# Patient Record
Sex: Female | Born: 1965 | Race: White | Hispanic: No | Smoking: Never smoker
Health system: Southern US, Community
[De-identification: ages and names within clinical notes are randomized; demographics above are authoritative.]

## PROBLEM LIST (undated history)

## (undated) DIAGNOSIS — F419 Anxiety disorder, unspecified: Secondary | ICD-10-CM

## (undated) DIAGNOSIS — L309 Dermatitis, unspecified: Secondary | ICD-10-CM

## (undated) DIAGNOSIS — F341 Dysthymic disorder: Secondary | ICD-10-CM

## (undated) DIAGNOSIS — E28319 Asymptomatic premature menopause: Secondary | ICD-10-CM

## (undated) DIAGNOSIS — Z79890 Hormone replacement therapy (postmenopausal): Secondary | ICD-10-CM

## (undated) DIAGNOSIS — F329 Major depressive disorder, single episode, unspecified: Secondary | ICD-10-CM

## (undated) HISTORY — DX: Hormone replacement therapy: E28.319

## (undated) HISTORY — DX: Major depressive disorder, single episode, unspecified: F32.9

## (undated) HISTORY — DX: Anxiety disorder, unspecified: F41.9

## (undated) HISTORY — PX: COLONOSCOPY: SHX174

## (undated) HISTORY — PX: TONSILLECTOMY: SUR1361

## (undated) HISTORY — PX: ADENOIDECTOMY: SUR15

## (undated) HISTORY — PX: WISDOM TOOTH EXTRACTION: SHX21

## (undated) HISTORY — DX: Asymptomatic premature menopause: Z79.890

## (undated) HISTORY — DX: Dysthymic disorder: F34.1

## (undated) HISTORY — DX: Dermatitis, unspecified: L30.9

---

## 2009-06-30 ENCOUNTER — Ambulatory Visit: Payer: Self-pay | Admitting: Sports Medicine

## 2009-06-30 DIAGNOSIS — M7750 Other enthesopathy of unspecified foot: Secondary | ICD-10-CM | POA: Insufficient documentation

## 2012-01-11 ENCOUNTER — Telehealth: Payer: Self-pay

## 2012-01-11 NOTE — Telephone Encounter (Signed)
Pt would like a call back in regards to getting cymbalta refilled

## 2012-01-13 MED ORDER — DULOXETINE HCL 60 MG PO CPEP: 60.0000 mg | ORAL_CAPSULE | Freq: Every day | ORAL | Status: DC

## 2012-01-13 NOTE — Telephone Encounter (Signed)
Spoke with patient, she understands she is due for an office visit.  Is going out of town this weekend, but will recheck next week.  Ok'd rx for 2 weeks until she can come in.  Patient last seen 06/2011.

## 2012-02-03 ENCOUNTER — Ambulatory Visit (INDEPENDENT_AMBULATORY_CARE_PROVIDER_SITE_OTHER): Payer: 59 | Admitting: Physician Assistant

## 2012-02-03 ENCOUNTER — Encounter: Payer: Self-pay | Admitting: Physician Assistant

## 2012-02-03 VITALS — BP 109/72 | HR 64 | Temp 98.1°F | Resp 16 | Ht 64.0 in | Wt 136.8 lb

## 2012-02-03 DIAGNOSIS — F3289 Other specified depressive episodes: Secondary | ICD-10-CM

## 2012-02-03 DIAGNOSIS — F329 Major depressive disorder, single episode, unspecified: Secondary | ICD-10-CM

## 2012-02-03 DIAGNOSIS — F32A Depression, unspecified: Secondary | ICD-10-CM

## 2012-02-03 MED ORDER — DULOXETINE HCL 60 MG PO CPEP: 60.0000 mg | ORAL_CAPSULE | Freq: Every day | ORAL | Status: DC

## 2012-02-03 NOTE — Progress Notes (Signed)
  Subjective:    Patient ID: Kelly Jensen, female    DOB: 02/18/1966, 46 y.o.   MRN: [id number]  HPI Pt presents for recheck of depression. Doing really good on Cymbalta 60mg .  Feels like it is the correct dose and really happy with the results.  No really seeing Dr. Ledon Snare any more because she is doing so good.   Review of Systems     Objective:   Physical Exam  Constitutional: She is oriented to person, place, and time. She appears well-developed and well-nourished.  HENT:  Head: Normocephalic and atraumatic.  Right Ear: External ear normal.  Left Ear: External ear normal.  Eyes: Conjunctivae are normal.  Pulmonary/Chest: Effort normal.  Neurological: She is alert and oriented to person, place, and time.  Skin: Skin is warm and dry.  Psychiatric: She has a normal mood and affect. Her behavior is normal. Judgment and thought content normal.          Assessment & Plan:  Depression - continue Cymbalta 60mg  - sent Rx for 1 yr.  F/u sooner if any change in symptoms or any problems.

## 2012-10-05 ENCOUNTER — Ambulatory Visit (INDEPENDENT_AMBULATORY_CARE_PROVIDER_SITE_OTHER): Payer: 59 | Admitting: Sports Medicine

## 2012-10-05 VITALS — BP 100/60 | Ht 64.0 in | Wt 130.0 lb

## 2012-10-05 DIAGNOSIS — M7750 Other enthesopathy of unspecified foot: Secondary | ICD-10-CM

## 2012-10-05 NOTE — Assessment & Plan Note (Addendum)
Patient has significant left foot fourth  Metatarsalgia. Additionally patient has significant transverse arch breakdown. Intervention:  Used metatarsal pads correctly positioned in the existing insoles and supplied patient with a pair of sports insoles with correctly placed metatarsal pads.  Additionally placed a first ray post under the left great toe. Followup as needed

## 2012-10-05 NOTE — Progress Notes (Signed)
Kelly Jensen is a 46 y.o. female who presents to [facility] today for left foot pain for 6 months. Patient notes pain in the dorsal aspect of her 3rd and 4th metatarsal heads. She recently completed a half marathon and is running 20-25 miles a week.  She diagnosed herself with metatarsalgia and placed a metatarsal pad directly underneath the metatarsal heads which increased her pain.  She denies any radiating pain weakness or numbness and feels well otherwise.    PMH reviewed. Otherwise healthy History  Substance Use Topics  . Smoking status: Never Smoker   . Smokeless tobacco: Not on file  . Alcohol Use: Not on file   ROS as above otherwise neg   Exam:  BP 100/60  Ht 5\' 4"  (1.626 m)  Wt 130 lb (58.968 kg)  BMI 22.31 kg/m2 Gen: Well NAD MSK: Left foot: Significant breakdown of the transverse arch with callus formation under metatarsal heads 2,3 and 4.  Deviation of the fourth and fifth toes and hammertoe deformity on the fourth.  Mildly tender on the fourth metatarsal head dorsal aspect. Longitudinal arch is preserved Normal sensation and capillary refill and pulses  Leg length: No discrepancy  Hip abductor strength: 5 /5 bilaterally  Gait: Generally well-appearing gait. Patient is a forefoot Striker with excessive terminal pronation on the left compared to the right.

## 2012-10-05 NOTE — Patient Instructions (Addendum)
Thank you for coming in today. You have metatarsalgia due to breakdown of the transverse arch.  Wear the insoles.  Let us know how you do.  You have Hapad Small Matatarsal pads in.  The green insoles are Hapad Sports insoles.  They should last 3-6 months.  The goal is to be just behind the metatarsal heads (bones) Come back as needed.

## 2013-02-03 ENCOUNTER — Telehealth: Payer: Self-pay

## 2013-02-03 MED ORDER — DULOXETINE HCL 60 MG PO CPEP: 60.0000 mg | ORAL_CAPSULE | Freq: Every day | ORAL | Status: DC

## 2013-02-03 NOTE — Telephone Encounter (Signed)
Pt needs a refill Cymbalta and wants someone to call her back

## 2013-02-03 NOTE — Telephone Encounter (Signed)
Cymbalta was written last year. Advised her will ask if we can send in a supply until she can come back in. Pended 12mo (since this is the least she can get from mail order). She knows she is due for appt.

## 2013-02-03 NOTE — Telephone Encounter (Signed)
Meds ordered this encounter  Medications  . DULoxetine (CYMBALTA) 60 MG capsule    Sig: Take 1 capsule (60 mg total) by mouth daily.    Dispense:  90 capsule    Refill:  0   Patient will need re-evaluation for additional refills.

## 2014-04-11 DIAGNOSIS — F339 Major depressive disorder, recurrent, unspecified: Secondary | ICD-10-CM | POA: Insufficient documentation

## 2014-04-11 DIAGNOSIS — E28319 Asymptomatic premature menopause: Secondary | ICD-10-CM | POA: Insufficient documentation

## 2014-04-11 DIAGNOSIS — N959 Unspecified menopausal and perimenopausal disorder: Secondary | ICD-10-CM | POA: Insufficient documentation

## 2016-12-14 DIAGNOSIS — Z23 Encounter for immunization: Secondary | ICD-10-CM | POA: Diagnosis not present

## 2017-01-28 DIAGNOSIS — H0289 Other specified disorders of eyelid: Secondary | ICD-10-CM | POA: Diagnosis not present

## 2017-01-28 DIAGNOSIS — L918 Other hypertrophic disorders of the skin: Secondary | ICD-10-CM | POA: Diagnosis not present

## 2017-03-16 DIAGNOSIS — H10413 Chronic giant papillary conjunctivitis, bilateral: Secondary | ICD-10-CM | POA: Diagnosis not present

## 2017-04-11 DIAGNOSIS — M531 Cervicobrachial syndrome: Secondary | ICD-10-CM | POA: Diagnosis not present

## 2017-04-11 DIAGNOSIS — M9901 Segmental and somatic dysfunction of cervical region: Secondary | ICD-10-CM | POA: Diagnosis not present

## 2017-04-11 DIAGNOSIS — M5386 Other specified dorsopathies, lumbar region: Secondary | ICD-10-CM | POA: Diagnosis not present

## 2017-04-12 DIAGNOSIS — M9901 Segmental and somatic dysfunction of cervical region: Secondary | ICD-10-CM | POA: Diagnosis not present

## 2017-04-12 DIAGNOSIS — M5386 Other specified dorsopathies, lumbar region: Secondary | ICD-10-CM | POA: Diagnosis not present

## 2017-04-12 DIAGNOSIS — M531 Cervicobrachial syndrome: Secondary | ICD-10-CM | POA: Diagnosis not present

## 2017-04-14 DIAGNOSIS — M531 Cervicobrachial syndrome: Secondary | ICD-10-CM | POA: Diagnosis not present

## 2017-04-14 DIAGNOSIS — M5386 Other specified dorsopathies, lumbar region: Secondary | ICD-10-CM | POA: Diagnosis not present

## 2017-04-14 DIAGNOSIS — M9901 Segmental and somatic dysfunction of cervical region: Secondary | ICD-10-CM | POA: Diagnosis not present

## 2017-04-15 DIAGNOSIS — N760 Acute vaginitis: Secondary | ICD-10-CM | POA: Diagnosis not present

## 2017-04-15 DIAGNOSIS — N390 Urinary tract infection, site not specified: Secondary | ICD-10-CM | POA: Diagnosis not present

## 2017-04-16 DIAGNOSIS — M531 Cervicobrachial syndrome: Secondary | ICD-10-CM | POA: Diagnosis not present

## 2017-04-16 DIAGNOSIS — M5386 Other specified dorsopathies, lumbar region: Secondary | ICD-10-CM | POA: Diagnosis not present

## 2017-04-16 DIAGNOSIS — M9901 Segmental and somatic dysfunction of cervical region: Secondary | ICD-10-CM | POA: Diagnosis not present

## 2017-04-18 DIAGNOSIS — M531 Cervicobrachial syndrome: Secondary | ICD-10-CM | POA: Diagnosis not present

## 2017-04-18 DIAGNOSIS — M9901 Segmental and somatic dysfunction of cervical region: Secondary | ICD-10-CM | POA: Diagnosis not present

## 2017-04-18 DIAGNOSIS — M5386 Other specified dorsopathies, lumbar region: Secondary | ICD-10-CM | POA: Diagnosis not present

## 2017-04-20 DIAGNOSIS — M5386 Other specified dorsopathies, lumbar region: Secondary | ICD-10-CM | POA: Diagnosis not present

## 2017-04-20 DIAGNOSIS — M531 Cervicobrachial syndrome: Secondary | ICD-10-CM | POA: Diagnosis not present

## 2017-04-20 DIAGNOSIS — M9901 Segmental and somatic dysfunction of cervical region: Secondary | ICD-10-CM | POA: Diagnosis not present

## 2017-04-22 DIAGNOSIS — M531 Cervicobrachial syndrome: Secondary | ICD-10-CM | POA: Diagnosis not present

## 2017-04-22 DIAGNOSIS — M5386 Other specified dorsopathies, lumbar region: Secondary | ICD-10-CM | POA: Diagnosis not present

## 2017-04-22 DIAGNOSIS — M9901 Segmental and somatic dysfunction of cervical region: Secondary | ICD-10-CM | POA: Diagnosis not present

## 2017-04-26 DIAGNOSIS — M5386 Other specified dorsopathies, lumbar region: Secondary | ICD-10-CM | POA: Diagnosis not present

## 2017-04-26 DIAGNOSIS — M531 Cervicobrachial syndrome: Secondary | ICD-10-CM | POA: Diagnosis not present

## 2017-04-26 DIAGNOSIS — M9901 Segmental and somatic dysfunction of cervical region: Secondary | ICD-10-CM | POA: Diagnosis not present

## 2017-04-27 DIAGNOSIS — M5386 Other specified dorsopathies, lumbar region: Secondary | ICD-10-CM | POA: Diagnosis not present

## 2017-04-27 DIAGNOSIS — M9901 Segmental and somatic dysfunction of cervical region: Secondary | ICD-10-CM | POA: Diagnosis not present

## 2017-04-27 DIAGNOSIS — M531 Cervicobrachial syndrome: Secondary | ICD-10-CM | POA: Diagnosis not present

## 2017-05-02 DIAGNOSIS — M531 Cervicobrachial syndrome: Secondary | ICD-10-CM | POA: Diagnosis not present

## 2017-05-02 DIAGNOSIS — M9901 Segmental and somatic dysfunction of cervical region: Secondary | ICD-10-CM | POA: Diagnosis not present

## 2017-05-02 DIAGNOSIS — M5386 Other specified dorsopathies, lumbar region: Secondary | ICD-10-CM | POA: Diagnosis not present

## 2017-05-03 DIAGNOSIS — N200 Calculus of kidney: Secondary | ICD-10-CM | POA: Diagnosis not present

## 2017-05-03 DIAGNOSIS — R3915 Urgency of urination: Secondary | ICD-10-CM | POA: Diagnosis not present

## 2017-05-05 DIAGNOSIS — M9901 Segmental and somatic dysfunction of cervical region: Secondary | ICD-10-CM | POA: Diagnosis not present

## 2017-05-05 DIAGNOSIS — M5386 Other specified dorsopathies, lumbar region: Secondary | ICD-10-CM | POA: Diagnosis not present

## 2017-05-05 DIAGNOSIS — M531 Cervicobrachial syndrome: Secondary | ICD-10-CM | POA: Diagnosis not present

## 2017-05-11 DIAGNOSIS — M531 Cervicobrachial syndrome: Secondary | ICD-10-CM | POA: Diagnosis not present

## 2017-05-11 DIAGNOSIS — M9901 Segmental and somatic dysfunction of cervical region: Secondary | ICD-10-CM | POA: Diagnosis not present

## 2017-05-11 DIAGNOSIS — M5386 Other specified dorsopathies, lumbar region: Secondary | ICD-10-CM | POA: Diagnosis not present

## 2017-05-13 DIAGNOSIS — M531 Cervicobrachial syndrome: Secondary | ICD-10-CM | POA: Diagnosis not present

## 2017-05-13 DIAGNOSIS — M5386 Other specified dorsopathies, lumbar region: Secondary | ICD-10-CM | POA: Diagnosis not present

## 2017-05-13 DIAGNOSIS — M9901 Segmental and somatic dysfunction of cervical region: Secondary | ICD-10-CM | POA: Diagnosis not present

## 2017-05-17 DIAGNOSIS — M5386 Other specified dorsopathies, lumbar region: Secondary | ICD-10-CM | POA: Diagnosis not present

## 2017-05-17 DIAGNOSIS — M9901 Segmental and somatic dysfunction of cervical region: Secondary | ICD-10-CM | POA: Diagnosis not present

## 2017-05-17 DIAGNOSIS — M531 Cervicobrachial syndrome: Secondary | ICD-10-CM | POA: Diagnosis not present

## 2017-05-20 DIAGNOSIS — M5386 Other specified dorsopathies, lumbar region: Secondary | ICD-10-CM | POA: Diagnosis not present

## 2017-05-20 DIAGNOSIS — M531 Cervicobrachial syndrome: Secondary | ICD-10-CM | POA: Diagnosis not present

## 2017-05-20 DIAGNOSIS — M9901 Segmental and somatic dysfunction of cervical region: Secondary | ICD-10-CM | POA: Diagnosis not present

## 2017-05-25 DIAGNOSIS — M531 Cervicobrachial syndrome: Secondary | ICD-10-CM | POA: Diagnosis not present

## 2017-05-25 DIAGNOSIS — M5386 Other specified dorsopathies, lumbar region: Secondary | ICD-10-CM | POA: Diagnosis not present

## 2017-05-25 DIAGNOSIS — M9901 Segmental and somatic dysfunction of cervical region: Secondary | ICD-10-CM | POA: Diagnosis not present

## 2017-06-14 DIAGNOSIS — M25552 Pain in left hip: Secondary | ICD-10-CM | POA: Diagnosis not present

## 2017-06-20 DIAGNOSIS — M25552 Pain in left hip: Secondary | ICD-10-CM | POA: Diagnosis not present

## 2017-06-23 DIAGNOSIS — M25552 Pain in left hip: Secondary | ICD-10-CM | POA: Diagnosis not present

## 2017-06-27 DIAGNOSIS — M25552 Pain in left hip: Secondary | ICD-10-CM | POA: Diagnosis not present

## 2017-07-04 DIAGNOSIS — M25552 Pain in left hip: Secondary | ICD-10-CM | POA: Diagnosis not present

## 2017-07-11 DIAGNOSIS — M25552 Pain in left hip: Secondary | ICD-10-CM | POA: Diagnosis not present

## 2017-08-30 DIAGNOSIS — Z23 Encounter for immunization: Secondary | ICD-10-CM | POA: Diagnosis not present

## 2018-01-03 DIAGNOSIS — Z1231 Encounter for screening mammogram for malignant neoplasm of breast: Secondary | ICD-10-CM | POA: Diagnosis not present

## 2018-01-03 DIAGNOSIS — Z01419 Encounter for gynecological examination (general) (routine) without abnormal findings: Secondary | ICD-10-CM | POA: Diagnosis not present

## 2018-03-09 DIAGNOSIS — H10413 Chronic giant papillary conjunctivitis, bilateral: Secondary | ICD-10-CM | POA: Diagnosis not present

## 2018-05-17 ENCOUNTER — Ambulatory Visit: Payer: 59 | Admitting: Family Medicine

## 2018-05-17 ENCOUNTER — Other Ambulatory Visit: Payer: Self-pay

## 2018-05-17 ENCOUNTER — Encounter: Payer: Self-pay | Admitting: Family Medicine

## 2018-05-17 VITALS — BP 102/60 | HR 74 | Temp 97.6°F | Ht 64.0 in | Wt 143.6 lb

## 2018-05-17 DIAGNOSIS — Z79899 Other long term (current) drug therapy: Secondary | ICD-10-CM

## 2018-05-17 DIAGNOSIS — F40243 Fear of flying: Secondary | ICD-10-CM | POA: Diagnosis not present

## 2018-05-17 DIAGNOSIS — Z114 Encounter for screening for human immunodeficiency virus [HIV]: Secondary | ICD-10-CM

## 2018-05-17 DIAGNOSIS — F341 Dysthymic disorder: Secondary | ICD-10-CM

## 2018-05-17 DIAGNOSIS — Z780 Asymptomatic menopausal state: Secondary | ICD-10-CM

## 2018-05-17 DIAGNOSIS — Z7189 Other specified counseling: Secondary | ICD-10-CM | POA: Diagnosis not present

## 2018-05-17 DIAGNOSIS — Z1211 Encounter for screening for malignant neoplasm of colon: Secondary | ICD-10-CM | POA: Diagnosis not present

## 2018-05-17 DIAGNOSIS — Z7184 Encounter for health counseling related to travel: Secondary | ICD-10-CM

## 2018-05-17 DIAGNOSIS — Z23 Encounter for immunization: Secondary | ICD-10-CM | POA: Diagnosis not present

## 2018-05-17 LAB — COMPREHENSIVE METABOLIC PANEL
ALT: 11 U/L (ref 0–35)
AST: 15 U/L (ref 0–37)
Albumin: 4.2 g/dL (ref 3.5–5.2)
Alkaline Phosphatase: 37 U/L — ABNORMAL LOW (ref 39–117)
BUN: 11 mg/dL (ref 6–23)
CO2: 30 mEq/L (ref 19–32)
Calcium: 9.3 mg/dL (ref 8.4–10.5)
Chloride: 104 mEq/L (ref 96–112)
Creatinine, Ser: 0.77 mg/dL (ref 0.40–1.20)
GFR: 84.15 mL/min (ref >60.00)
Glucose, Bld: 89 mg/dL (ref 70–99)
Potassium: 3.8 mEq/L (ref 3.5–5.1)
Sodium: 139 mEq/L (ref 135–145)
Total Bilirubin: 0.9 mg/dL (ref 0.2–1.2)
Total Protein: 6.3 g/dL (ref 6.0–8.3)

## 2018-05-17 LAB — CBC WITH DIFFERENTIAL/PLATELET
Basophils Absolute: 0 10*3/uL (ref 0.0–0.1)
Basophils Relative: 0.5 % (ref 0.0–3.0)
Eosinophils Absolute: 0.1 10*3/uL (ref 0.0–0.7)
Eosinophils Relative: 0.8 % (ref 0.0–5.0)
HCT: 41.2 % (ref 36.0–46.0)
Hemoglobin: 14.4 g/dL (ref 12.0–15.0)
Lymphocytes Relative: 29.8 % (ref 12.0–46.0)
Lymphs Abs: 2.4 10*3/uL (ref 0.7–4.0)
MCHC: 35 g/dL (ref 30.0–36.0)
MCV: 90.2 fl (ref 78.0–100.0)
Monocytes Absolute: 0.6 10*3/uL (ref 0.1–1.0)
Monocytes Relative: 7.6 % (ref 3.0–12.0)
Neutro Abs: 4.9 10*3/uL (ref 1.4–7.7)
Neutrophils Relative %: 61.3 % (ref 43.0–77.0)
Platelets: 206 10*3/uL (ref 150.0–400.0)
RBC: 4.56 Mil/uL (ref 3.87–5.11)
RDW: 13.6 % (ref 11.5–15.5)
WBC: 8 10*3/uL (ref 4.0–10.5)

## 2018-05-17 LAB — LIPID PANEL
Cholesterol: 179 mg/dL (ref 0–200)
HDL: 56.4 mg/dL (ref >39.00)
LDL Cholesterol: 108 mg/dL — ABNORMAL HIGH (ref 0–99)
NonHDL: 122.83
Total CHOL/HDL Ratio: 3
Triglycerides: 74 mg/dL (ref 0.0–149.0)
VLDL: 14.8 mg/dL (ref 0.0–40.0)

## 2018-05-17 MED ORDER — ONDANSETRON 4 MG PO TBDP: 4.0000 mg | ORAL_TABLET | Freq: Three times a day (TID) | ORAL | 0 refills | Status: DC | PRN

## 2018-05-17 MED ORDER — ALPRAZOLAM 0.5 MG PO TABS: 0.5000 mg | ORAL_TABLET | Freq: Two times a day (BID) | ORAL | 0 refills | Status: DC | PRN

## 2018-05-17 MED ORDER — TRINTELLIX 20 MG PO TABS
20.0000 mg | ORAL_TABLET | Freq: Every day | ORAL | 3 refills | Status: DC
Start: 2018-05-17 — End: 2018-09-13

## 2018-05-17 MED ORDER — PROGESTERONE MICRONIZED 200 MG PO CAPS: 200.0000 mg | ORAL_CAPSULE | Freq: Every day | ORAL | 3 refills | Status: DC

## 2018-05-17 NOTE — Progress Notes (Signed)
Kelly Jensen is a 52 y.o. female is here to [place].   Patient Care Team: Briscoe Deutscher, DO as PCP - General (Family Medicine)   History of Present Illness:   HPI: Taking daughter, age 65, to [place] to see where she was born. Needing vaccinations. Flight anxiety and will be flying often throughout the trip. Husband and children already vaccinated. Previous yellow fever vaccination. Already have Malorone and Zpak as part of kit. Exercises regularly. Hx of depression - well controlled on Trintellix. Hx of early menopause - on HRT.  Health Maintenance Due  Topic Date Due  . HIV Screening  10/29/1982  . TETANUS/TDAP  10/29/1986  . PAP SMEAR  10/29/1988  . MAMMOGRAM  10/29/2017  . COLONOSCOPY  10/29/2017   No flowsheet data found.  PMHx, SurgHx, SocialHx, Medications, and Allergies were reviewed in the Visit Navigator and updated as appropriate.   History reviewed. No pertinent past medical history.  History reviewed. No pertinent surgical history.  History reviewed. No pertinent family history.  Social History   Tobacco Use  . Smoking status: Never Smoker  . Smokeless tobacco: Never Used  Substance Use Topics  . Alcohol use: Not on file  . Drug use: Not on file    Current Medications and Allergies:   .  LORazepam (ATIVAN) 0.5 MG tablet, Take 0.5 mg by mouth as needed. For flying, Disp: , Rfl:  .  progesterone (PROMETRIUM) 200 MG capsule, Take 1 capsule (200 mg total) by mouth daily., Disp: 30 capsule, Rfl: 3 .  TRINTELLIX 20 MG TABS tablet, Take 1 tablet (20 mg total) by mouth daily., Disp: 30 tablet, Rfl: 3  No Known Allergies   Review of Systems:   Pertinent items are noted in the HPI. Otherwise, ROS is negative.  Vitals:   Vitals:   05/17/18 1319  BP: 102/60  Pulse: 74  Temp: 97.6 F (36.4 C)  TempSrc: Oral  SpO2: 97%  Weight: 143 lb 9.6 oz (65.1 kg)  Height: _0  (1.626 m)     Body mass index is 24.65 kg/m.  Physical Exam:    Physical Exam  Constitutional: She is oriented to person, place, and time. She appears well-developed and well-nourished. No distress.  HENT:  Head: Normocephalic and atraumatic.  Right Ear: External ear normal.  Left Ear: External ear normal.  Nose: Nose normal.  Mouth/Throat: Oropharynx is clear and moist.  Eyes: Pupils are equal, round, and reactive to light. Conjunctivae and EOM are normal.  Neck: Normal range of motion. Neck supple. No thyromegaly present.  Cardiovascular: Normal rate, regular rhythm, normal heart sounds and intact distal pulses.  Pulmonary/Chest: Effort normal and breath sounds normal.  Abdominal: Soft. Bowel sounds are normal.  Musculoskeletal: Normal range of motion.  Lymphadenopathy:    She has no cervical adenopathy.  Neurological: She is alert and oriented to person, place, and time.  Skin: Skin is warm and dry. Capillary refill takes less than 2 seconds.  Psychiatric: She has a normal mood and affect. Her behavior is normal.  Nursing note and vitals reviewed.  Assessment and Plan:   Jayda was seen today for establish care.  Diagnoses and all orders for this visit:  Travel advice encounter -     ondansetron (ZOFRAN ODT) 4 MG disintegrating tablet; Take 1 tablet (4 mg total) by mouth every 8 (eight) hours as needed for nausea or vomiting.  Screening for colon cancer -     Ambulatory referral to Gastroenterology  Encounter for  screening for HIV -     HIV antibody  Need for hepatitis A immunization -     Hepatitis A vaccine adult IM  Need for Tdap vaccination -     Tdap vaccine greater than or equal to 7yo IM  Need for MMR vaccine -     MMR vaccine subcutaneous  Medication management -     CBC with Differential/Platelet -     Comprehensive metabolic panel -     Lipid panel  Dysthymia -     TRINTELLIX 20 MG TABS tablet; Take 1 tablet (20 mg total) by mouth daily.  Postmenopausal -     progesterone (PROMETRIUM) 200 MG capsule; Take 1  capsule (200 mg total) by mouth daily.  Fear of flying -     ALPRAZolam (XANAX) 0.5 MG tablet; Take 1 tablet (0.5 mg total) by mouth 2 (two) times daily as needed for anxiety.    . Reviewed expectations re: course of current medical issues. . Discussed self-management of symptoms. . Outlined signs and symptoms indicating need for more acute intervention. . Patient verbalized understanding and all questions were answered. Marland Kitchen Health Maintenance issues including appropriate healthy diet, exercise, and smoking avoidance were discussed with patient. . See orders for this visit as documented in the electronic medical record. . Patient received an After Visit Summary.  Briscoe Deutscher, DO , [facility] 05/17/2018

## 2018-05-18 LAB — HIV ANTIBODY (ROUTINE TESTING W REFLEX): HIV 1&2 Ab, 4th Generation: NONREACTIVE

## 2018-06-06 ENCOUNTER — Telehealth: Payer: Self-pay | Admitting: *Deleted

## 2018-06-06 NOTE — Telephone Encounter (Signed)
Copied from CRM [id number]. Topic: General - Other >> Jun 06, 2018 10:59 AM Elliot GaultBell, Tiffany M wrote: Relation to pt: self  Call back number:[phone] [facility] [address], [place], [place] [address] [facility]New Pharmacy)  Reason for call:  Patient was last seen 05/17/18 and stated if she needed anything PCP advised to send a request. Patient [place] to [place] 05[place]18 and would like PCP to prescribe bactrim ds #28 and ambien, please advise >> Jun 06, 2018 11:03 AM Elliot GaultBell, Tiffany M wrote: Relation to pt: self  Call back number:[phone] [facility] [address], [place], [place] [address] [facility](*New Pharmacy)  Reason for call:  Patient was last seen 05/17/18 and stated if she needed anything PCP advised to send a request. Patient traveling to [place] on Friday 05/10/18 and would like PCP to prescribe bactrim ds #28 and ambien, please advise

## 2018-06-06 NOTE — Telephone Encounter (Signed)
Ok to send scripts?

## 2018-06-07 MED ORDER — ZOLPIDEM TARTRATE 5 MG PO TABS: 5.0000 mg | ORAL_TABLET | Freq: Every evening | ORAL | 0 refills | Status: DC | PRN

## 2018-06-07 MED ORDER — SULFAMETHOXAZOLE-TRIMETHOPRIM 800-160 MG PO TABS: 1.0000 | ORAL_TABLET | Freq: Two times a day (BID) | ORAL | 0 refills | Status: DC

## 2018-06-07 NOTE — Telephone Encounter (Signed)
Sent!

## 2018-06-07 NOTE — Addendum Note (Signed)
Addended by: Helane RimaWALLACE, Megen Madewell R on: 06/07/2018 07:11 AM   Modules accepted: Orders

## 2018-06-07 NOTE — Telephone Encounter (Signed)
Pt notified Rx's were sent to pharmacy. 

## 2018-07-17 ENCOUNTER — Encounter: Payer: Self-pay | Admitting: Family Medicine

## 2018-09-13 ENCOUNTER — Other Ambulatory Visit: Payer: Self-pay | Admitting: Family Medicine

## 2018-09-13 DIAGNOSIS — F341 Dysthymic disorder: Secondary | ICD-10-CM

## 2018-09-17 DIAGNOSIS — Z23 Encounter for immunization: Secondary | ICD-10-CM | POA: Diagnosis not present

## 2018-09-27 ENCOUNTER — Encounter: Payer: Self-pay | Admitting: Internal Medicine

## 2018-09-27 ENCOUNTER — Encounter: Payer: Self-pay | Admitting: Family Medicine

## 2018-11-03 ENCOUNTER — Encounter: Payer: 59 | Admitting: Internal Medicine

## 2018-12-04 ENCOUNTER — Encounter: Payer: Self-pay | Admitting: Gastroenterology

## 2018-12-21 ENCOUNTER — Ambulatory Visit (AMBULATORY_SURGERY_CENTER): Payer: Self-pay | Admitting: *Deleted

## 2018-12-21 ENCOUNTER — Encounter: Payer: Self-pay | Admitting: Gastroenterology

## 2018-12-21 VITALS — Ht 64.0 in | Wt 140.8 lb

## 2018-12-21 DIAGNOSIS — Z1211 Encounter for screening for malignant neoplasm of colon: Secondary | ICD-10-CM

## 2018-12-21 MED ORDER — NA SULFATE-K SULFATE-MG SULF 17.5-3.13-1.6 GM/177ML PO SOLN: 1.0000 | Freq: Once | ORAL | 0 refills | Status: AC

## 2018-12-21 NOTE — Progress Notes (Signed)
No egg or soy allergy known to patient  No issues with past sedation with any surgeries  or procedures, no intubation problems  No diet pills per patient No home 02 use per patient  No blood thinners per patient  Pt denies issues with constipation  No A fib or A flutter  EMMI video sent to pt's e mail - pt declined  Suprep $15 coupon to pt   

## 2019-01-01 ENCOUNTER — Other Ambulatory Visit: Payer: Self-pay | Admitting: Physician Assistant

## 2019-01-01 MED ORDER — OSELTAMIVIR PHOSPHATE 75 MG PO CAPS: 75.0000 mg | ORAL_CAPSULE | Freq: Two times a day (BID) | ORAL | 0 refills | Status: DC

## 2019-01-01 NOTE — Progress Notes (Signed)
Patient seen today at office during her husband's appointment. He tested positive for flu A and she has started similar symptoms this morning. Reviewed chart. Tamiflu sent in.

## 2019-01-05 ENCOUNTER — Encounter: Payer: 59 | Admitting: Gastroenterology

## 2019-02-27 ENCOUNTER — Telehealth: Payer: Self-pay | Admitting: Physical Therapy

## 2019-02-27 NOTE — Telephone Encounter (Signed)
Prior Auth initiated for Trintellix 20 mg on 02/27/19.  Waiting for response

## 2019-03-01 NOTE — Telephone Encounter (Signed)
Unfavorable response received from Cover My Meds.  Will look into what would be considered appropriate alternatives.

## 2019-03-02 ENCOUNTER — Telehealth: Payer: Self-pay | Admitting: Physical Therapy

## 2019-03-02 NOTE — Telephone Encounter (Signed)
Trintellix 20 mg has been denied for Kelly Jensen.  Denial states that it will only be covered if pt has a hx of failure to 4 weeks of or cannot take at least 2 of the following (date of trial must be included): Bupropion Citalopram Escitalopram Fluoxetine Fluvoxamine Paroxetine Sertraline Venlafaxine IR or ER  Please advise how you would like to proceed.  Thanks and have a good weekend. -Kirt Boys

## 2019-03-04 NOTE — Telephone Encounter (Signed)
Call patient to clarify which medications that she has tried in the past based on the list provided in previous message and resubmit.

## 2019-03-05 NOTE — Telephone Encounter (Signed)
See note

## 2019-03-05 NOTE — Telephone Encounter (Signed)
Called pt and left VM to call the office.  

## 2019-03-05 NOTE — Telephone Encounter (Signed)
Called patient she has taken Zoloft for 2 years she d/c about 5 years ago due to side effects  Lexapro was taken for three weeks after Zoloft was d/c. Not able to take due to n&V

## 2019-03-05 NOTE — Telephone Encounter (Signed)
Patient returning call.

## 2019-03-06 ENCOUNTER — Other Ambulatory Visit: Payer: Self-pay | Admitting: Family Medicine

## 2019-03-06 DIAGNOSIS — F341 Dysthymic disorder: Secondary | ICD-10-CM

## 2019-03-06 NOTE — Telephone Encounter (Signed)
Last OV 05/17/2018 Last refill 09/13/2018 #30/5 Next OV not scheduled

## 2019-03-06 NOTE — Telephone Encounter (Signed)
Virtual visit scheduled for 03/07/2019 at 3:40 PM.

## 2019-03-07 ENCOUNTER — Ambulatory Visit (INDEPENDENT_AMBULATORY_CARE_PROVIDER_SITE_OTHER): Payer: 59 | Admitting: Family Medicine

## 2019-03-07 ENCOUNTER — Encounter: Payer: Self-pay | Admitting: Family Medicine

## 2019-03-07 VITALS — Ht 64.0 in | Wt 133.0 lb

## 2019-03-07 DIAGNOSIS — F341 Dysthymic disorder: Secondary | ICD-10-CM | POA: Diagnosis not present

## 2019-03-07 DIAGNOSIS — F40243 Fear of flying: Secondary | ICD-10-CM | POA: Diagnosis not present

## 2019-03-07 MED ORDER — ALPRAZOLAM 0.5 MG PO TABS: 0.5000 mg | ORAL_TABLET | Freq: Two times a day (BID) | ORAL | 0 refills | Status: DC | PRN

## 2019-03-07 MED ORDER — TRINTELLIX 20 MG PO TABS: 20.0000 mg | ORAL_TABLET | Freq: Every day | ORAL | 3 refills | Status: DC

## 2019-03-07 NOTE — Telephone Encounter (Signed)
Rx pending to be refilled at virtual visit today.

## 2019-03-07 NOTE — Progress Notes (Signed)
   Virtual Visit via Video   I connected with Kelly Jensen on 03/07/19 at  3:40 PM EDT by a video enabled telemedicine application and verified that I am speaking with the correct person using two identifiers. Location patient: Home Location provider: [facility], Office Persons participating in the virtual visit: Kelly Jensen, Kouris, DO   I discussed the limitations of evaluation and management by telemedicine and the availability of in person appointments. The patient expressed understanding and agreed to proceed.  Subjective:   HPI: Patient in for documentation for authorization on trintellix. She also has requested refill on Xanax that she takes when traveling.  Doing well. Medication working well. Last trip was great and hoping to travel more once restrictions lifted. No SI, HI. Sleeping okay.  Reviewed all precautions and expectations with prevention of Covid-19 ROS: See pertinent positives and negatives per HPI.  Patient Active Problem List   Diagnosis Date Noted  . 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 Smoking status: Never Smoker  . Smokeless tobacco: Never Used  Substance Use Topics  . Alcohol use: Yes    Comment: occ    Current Outpatient Medications:  .  ALPRAZolam (XANAX) 0.5 MG tablet, Take 1 tablet (0.5 mg total) by mouth 2 (two) times daily as needed for anxiety., Disp: 30 tablet, Rfl: 0 .  clobetasol ointment (TEMOVATE) 0.05 %, clobetasol 0.05 % topical ointment, Disp: , Rfl:  .  Estradiol-Estriol-Progesterone (BIEST/PROGESTERONE) CREA, Place onto the skin. 2 clicks a day, Disp: , Rfl:  .  progesterone (PROMETRIUM) 200 MG capsule, Take 1 capsule (200 mg total) by mouth daily., Disp: 30 capsule, Rfl: 3 .  TRINTELLIX 20 MG TABS tablet, Take 1 tablet (20 mg total) by mouth daily., Disp: 90 tablet, Rfl: 3  No Known Allergies  Objective:   VITALS: Per patient if applicable, see vitals. GENERAL:  Alert, appears well and in no acute distress. HEENT: Atraumatic, conjunctiva clear, no obvious abnormalities on inspection of external nose and ears. NECK: Normal movements of the head and neck. CARDIOPULMONARY: No increased WOB. Speaking in clear sentences. I:E ratio WNL.  MS: Moves all visible extremities without noticeable abnormality. PSYCH: Pleasant and cooperative, well-groomed. Speech normal rate and rhythm. Affect is appropriate. Insight and judgement are appropriate. Attention is focused, linear, and appropriate.  NEURO: CN grossly intact. Oriented as arrived to appointment on time with no prompting. Moves both UE equally.  SKIN: No obvious lesions, wounds, erythema, or cyanosis noted on face or hands.  Assessment and Plan:   Kelly Jensen was seen today for follow-up.  Diagnoses and all orders for this visit:  Dysthymia -     TRINTELLIX 20 MG TABS tablet; Take 1 tablet (20 mg total) by mouth daily.  Fear of flying -     ALPRAZolam (XANAX) 0.5 MG tablet; Take 1 tablet (0.5 mg total) by mouth 2 (two) times daily as needed for anxiety.    . Reviewed expectations re: course of current medical issues. . Discussed self-management of symptoms. . Outlined signs and symptoms indicating need for more acute intervention. . Patient verbalized understanding and all questions were answered. Marland Kitchen Health Maintenance issues including appropriate healthy diet, exercise, and smoking avoidance were discussed with patient. . See orders for this visit as documented in the electronic medical record.  Kelly Rima, DO 03/07/2019

## 2019-03-08 NOTE — Telephone Encounter (Signed)
Patient was seen in office

## 2019-04-09 DIAGNOSIS — M531 Cervicobrachial syndrome: Secondary | ICD-10-CM | POA: Diagnosis not present

## 2019-04-09 DIAGNOSIS — M5386 Other specified dorsopathies, lumbar region: Secondary | ICD-10-CM | POA: Diagnosis not present

## 2019-04-09 DIAGNOSIS — M9901 Segmental and somatic dysfunction of cervical region: Secondary | ICD-10-CM | POA: Diagnosis not present

## 2019-04-11 DIAGNOSIS — M531 Cervicobrachial syndrome: Secondary | ICD-10-CM | POA: Diagnosis not present

## 2019-04-11 DIAGNOSIS — M9901 Segmental and somatic dysfunction of cervical region: Secondary | ICD-10-CM | POA: Diagnosis not present

## 2019-04-11 DIAGNOSIS — M5386 Other specified dorsopathies, lumbar region: Secondary | ICD-10-CM | POA: Diagnosis not present

## 2019-04-16 DIAGNOSIS — M9901 Segmental and somatic dysfunction of cervical region: Secondary | ICD-10-CM | POA: Diagnosis not present

## 2019-04-16 DIAGNOSIS — M5386 Other specified dorsopathies, lumbar region: Secondary | ICD-10-CM | POA: Diagnosis not present

## 2019-04-16 DIAGNOSIS — M531 Cervicobrachial syndrome: Secondary | ICD-10-CM | POA: Diagnosis not present

## 2019-04-18 DIAGNOSIS — M5386 Other specified dorsopathies, lumbar region: Secondary | ICD-10-CM | POA: Diagnosis not present

## 2019-04-18 DIAGNOSIS — M9901 Segmental and somatic dysfunction of cervical region: Secondary | ICD-10-CM | POA: Diagnosis not present

## 2019-04-18 DIAGNOSIS — M531 Cervicobrachial syndrome: Secondary | ICD-10-CM | POA: Diagnosis not present

## 2019-05-05 ENCOUNTER — Other Ambulatory Visit: Payer: Self-pay | Admitting: Family Medicine

## 2019-05-05 DIAGNOSIS — Z780 Asymptomatic menopausal state: Secondary | ICD-10-CM

## 2019-05-09 LAB — HM PAP SMEAR

## 2020-03-13 ENCOUNTER — Other Ambulatory Visit: Payer: Self-pay

## 2020-03-13 ENCOUNTER — Ambulatory Visit (INDEPENDENT_AMBULATORY_CARE_PROVIDER_SITE_OTHER): Payer: 59 | Admitting: Family Medicine

## 2020-03-13 ENCOUNTER — Encounter: Payer: Self-pay | Admitting: Family Medicine

## 2020-03-13 VITALS — BP 116/80 | HR 102 | Temp 97.9°F | Resp 15 | Ht 64.0 in | Wt 140.6 lb

## 2020-03-13 DIAGNOSIS — F40243 Fear of flying: Secondary | ICD-10-CM | POA: Insufficient documentation

## 2020-03-13 DIAGNOSIS — Z1211 Encounter for screening for malignant neoplasm of colon: Secondary | ICD-10-CM

## 2020-03-13 DIAGNOSIS — F341 Dysthymic disorder: Secondary | ICD-10-CM | POA: Insufficient documentation

## 2020-03-13 DIAGNOSIS — Z0000 Encounter for general adult medical examination without abnormal findings: Secondary | ICD-10-CM

## 2020-03-13 DIAGNOSIS — F339 Major depressive disorder, recurrent, unspecified: Secondary | ICD-10-CM | POA: Insufficient documentation

## 2020-03-13 DIAGNOSIS — Z79890 Hormone replacement therapy (postmenopausal): Secondary | ICD-10-CM

## 2020-03-13 DIAGNOSIS — Z1212 Encounter for screening for malignant neoplasm of rectum: Secondary | ICD-10-CM

## 2020-03-13 DIAGNOSIS — E28319 Asymptomatic premature menopause: Secondary | ICD-10-CM

## 2020-03-13 LAB — TSH: TSH: 2.08 u[IU]/mL (ref 0.35–4.50)

## 2020-03-13 LAB — CBC WITH DIFFERENTIAL/PLATELET
Basophils Absolute: 0 10*3/uL (ref 0.0–0.1)
Basophils Relative: 0.7 % (ref 0.0–3.0)
Eosinophils Absolute: 0.1 10*3/uL (ref 0.0–0.7)
Eosinophils Relative: 1.2 % (ref 0.0–5.0)
HCT: 42.5 % (ref 36.0–46.0)
Hemoglobin: 14.4 g/dL (ref 12.0–15.0)
Lymphocytes Relative: 27.8 % (ref 12.0–46.0)
Lymphs Abs: 1.8 10*3/uL (ref 0.7–4.0)
MCHC: 33.7 g/dL (ref 30.0–36.0)
MCV: 89.5 fl (ref 78.0–100.0)
Monocytes Absolute: 0.5 10*3/uL (ref 0.1–1.0)
Monocytes Relative: 8.5 % (ref 3.0–12.0)
Neutro Abs: 4 10*3/uL (ref 1.4–7.7)
Neutrophils Relative %: 61.8 % (ref 43.0–77.0)
Platelets: 205 10*3/uL (ref 150.0–400.0)
RBC: 4.76 Mil/uL (ref 3.87–5.11)
RDW: 14.1 % (ref 11.5–15.5)
WBC: 6.4 10*3/uL (ref 4.0–10.5)

## 2020-03-13 LAB — LIPID PANEL
Cholesterol: 164 mg/dL (ref 0–200)
HDL: 42.9 mg/dL (ref >39.00)
LDL Cholesterol: 101 mg/dL — ABNORMAL HIGH (ref 0–99)
NonHDL: 120.97
Total CHOL/HDL Ratio: 4
Triglycerides: 98 mg/dL (ref 0.0–149.0)
VLDL: 19.6 mg/dL (ref 0.0–40.0)

## 2020-03-13 LAB — COMPREHENSIVE METABOLIC PANEL
ALT: 9 U/L (ref 0–35)
AST: 13 U/L (ref 0–37)
Albumin: 4.2 g/dL (ref 3.5–5.2)
Alkaline Phosphatase: 35 U/L — ABNORMAL LOW (ref 39–117)
BUN: 10 mg/dL (ref 6–23)
CO2: 32 mEq/L (ref 19–32)
Calcium: 9.4 mg/dL (ref 8.4–10.5)
Chloride: 105 mEq/L (ref 96–112)
Creatinine, Ser: 0.73 mg/dL (ref 0.40–1.20)
GFR: 83.6 mL/min (ref >60.00)
Glucose, Bld: 84 mg/dL (ref 70–99)
Potassium: 4.1 mEq/L (ref 3.5–5.1)
Sodium: 141 mEq/L (ref 135–145)
Total Bilirubin: 0.7 mg/dL (ref 0.2–1.2)
Total Protein: 5.9 g/dL — ABNORMAL LOW (ref 6.0–8.3)

## 2020-03-13 MED ORDER — ALPRAZOLAM 0.5 MG PO TABS: 0.5000 mg | ORAL_TABLET | Freq: Two times a day (BID) | ORAL | 0 refills | Status: DC | PRN

## 2020-03-13 MED ORDER — TRINTELLIX 20 MG PO TABS: 20.0000 mg | ORAL_TABLET | Freq: Every day | ORAL | 3 refills | Status: DC

## 2020-03-13 NOTE — Patient Instructions (Signed)
Please return in 12 months for your annual complete physical; please come fasting.  I will release your lab results to you on your MyChart account with further instructions. Please reply with any questions.   I have referred you to gastroenterology for a colonoscopy. We will call you to get you scheduled.   It was a pleasure meeting you today! Thank you for choosing [place] to meet your healthcare needs! I truly look forward to working with you. If you have any questions or concerns, please send me a message via Mychart or call the office at [phone].   Preventive Care 2-53 Years Old, Female Preventive care refers to visits with your health care provider and lifestyle choices that can promote health and wellness. This includes:  A yearly physical exam. This may also be called an annual well check.  Regular dental visits and eye exams.  Immunizations.  Screening for certain conditions.  Healthy lifestyle choices, such as eating a healthy diet, getting regular exercise, not using drugs or products that contain nicotine and tobacco, and limiting alcohol use. What can I expect for my preventive care visit? Physical exam Your health care provider will check your:  Height and weight. This may be used to calculate body mass index (BMI), which tells if you are at a healthy weight.  Heart rate and blood pressure.  Skin for abnormal spots. Counseling Your health care provider may ask you questions about your:  Alcohol, tobacco, and drug use.  Emotional well-being.  Home and relationship well-being.  Sexual activity.  Eating habits.  Work and work Statistician.  Method of birth control.  Menstrual cycle.  Pregnancy history. What immunizations do I need?  Influenza (flu) vaccine  This is recommended every year. Tetanus, diphtheria, and pertussis (Tdap) vaccine  You may need a Td booster every 10 years. Varicella (chickenpox) vaccine  You may need this if you have not  been vaccinated. Zoster (shingles) vaccine  You may need this after age 39. Measles, mumps, and rubella (MMR) vaccine  You may need at least one dose of MMR if you were born in 1957 or later. You may also need a second dose. Pneumococcal conjugate (PCV13) vaccine  You may need this if you have certain conditions and were not previously vaccinated. Pneumococcal polysaccharide (PPSV23) vaccine  You may need one or two doses if you smoke cigarettes or if you have certain conditions. Meningococcal conjugate (MenACWY) vaccine  You may need this if you have certain conditions. Hepatitis A vaccine  You may need this if you have certain conditions or if you travel or work in places where you may be exposed to hepatitis A. Hepatitis B vaccine  You may need this if you have certain conditions or if you travel or work in places where you may be exposed to hepatitis B. Haemophilus influenzae type b (Hib) vaccine  You may need this if you have certain conditions. Human papillomavirus (HPV) vaccine  If recommended by your health care provider, you may need three doses over 6 months. You may receive vaccines as individual doses or as more than one vaccine together in one shot (combination vaccines). Talk with your health care provider about the risks and benefits of combination vaccines. What tests do I need? Blood tests  Lipid and cholesterol levels. These may be checked every 5 years, or more frequently if you are over 79 years old.  Hepatitis C test.  Hepatitis B test. Screening  Lung cancer screening. You may have this screening  every year starting at age 61 if you have a 30-pack-year history of smoking and currently smoke or have quit within the past 15 years.  Colorectal cancer screening. All adults should have this screening starting at age 53 and continuing until age 80. Your health care provider may recommend screening at age 27 if you are at increased risk. You will have tests  every 1-10 years, depending on your results and the type of screening test.  Diabetes screening. This is done by checking your blood sugar (glucose) after you have not eaten for a while (fasting). You may have this done every 1-3 years.  Mammogram. This may be done every 1-2 years. Talk with your health care provider about when you should start having regular mammograms. This may depend on whether you have a family history of breast cancer.  BRCA-related cancer screening. This may be done if you have a family history of breast, ovarian, tubal, or peritoneal cancers.  Pelvic exam and Pap test. This may be done every 3 years starting at age 74. Starting at age 41, this may be done every 5 years if you have a Pap test in combination with an HPV test. Other tests  Sexually transmitted disease (STD) testing.  Bone density scan. This is done to screen for osteoporosis. You may have this scan if you are at high risk for osteoporosis. Follow these instructions at home: Eating and drinking  Eat a diet that includes fresh fruits and vegetables, whole grains, lean protein, and low-fat dairy.  Take vitamin and mineral supplements as recommended by your health care provider.  Do not drink alcohol if: ? Your health care provider tells you not to drink. ? You are pregnant, may be pregnant, or are planning to become pregnant.  If you drink alcohol: ? Limit how much you have to 0-1 drink a day. ? Be aware of how much alcohol is in your drink. In the [place] one drink equals one 12 oz bottle of beer (355 mL), one 5 oz glass of wine (148 mL), or one 1 oz glass of hard liquor (44 mL). Lifestyle  Take daily care of your teeth and gums.  Stay active. Exercise for at least 30 minutes on 5 or more days each week.  Do not use any products that contain nicotine or tobacco, such as cigarettes, e-cigarettes, and chewing tobacco. If you need help quitting, ask your health care provider.  If you are sexually  active, practice safe sex. Use a condom or other form of birth control (contraception) in order to prevent pregnancy and STIs (sexually transmitted infections).  If told by your health care provider, take low-dose aspirin daily starting at age 76. What's next?  Visit your health care provider once a year for a well check visit.  Ask your health care provider how often you should have your eyes and teeth checked.  Stay up to date on all vaccines. This information is not intended to replace advice given to you by your health care provider. Make sure you discuss any questions you have with your health care provider. Document Revised: 07/27/2018 Document Reviewed: 07/27/2018 Elsevier Patient Education  2020 [employer].

## 2020-03-13 NOTE — Progress Notes (Signed)
Subjective  Chief Complaint  Patient presents with  . Transitions Of Care    CPE  . Annual Exam    HPI: Kelly Jensen is a 54 y.o. female who presents to [facility] today for a Female Wellness Visit. She also has the concerns and/or needs as listed above in the chief complaint. These will be addressed in addition to the Health Maintenance Visit.   Wellness Visit: annual visit with health maintenance review and exam without Pap   HM: due colonoscopy; had set up last year but was canceled due to covid. Ready to reschedule. mammo and pap up to date per Dr. Philis Pique. On HRT since premature menopause and stable. Healthy lifestyle. Two older children in college and 67 yo adopted daughter who was born in [place]. Happy. Stable on meds Chronic disease f/u and/or acute problem visit: (deemed necessary to be done in addition to the wellness visit):  Dysthymia: chronic: needs trintellix refilled. Doing well w/o concerns. Has been on for many years.   Xanax prn for flying and occ sleep. Had 30 prescribed a year ago. Needs refill.   Assessment  1. Annual physical exam   2. Dysthymia   3. Fear of flying   4. Screening for colorectal cancer   5. Premature menopause on HRT      Plan  Female Wellness Visit:  Age appropriate Health Maintenance and Prevention measures were discussed with patient. Included topics are cancer screening recommendations, ways to keep healthy (see AVS) including dietary and exercise recommendations, regular eye and dental care, use of seat belts, and avoidance of moderate alcohol use and tobacco use. Refer for colonoscopy.   BMI: discussed patient's BMI and encouraged positive lifestyle modifications to help get to or maintain a target BMI.  HM needs and immunizations were addressed and ordered. See below for orders. See HM and immunization section for updates. utd  Routine labs and screening tests ordered including cmp, cbc and lipids  where appropriate.  Discussed recommendations regarding Vit D and calcium supplementation (see AVS)  Chronic disease management visit and/or acute problem visit:  Refilled trintellix for mood and xanax for flying.  Follow up: No follow-ups on file.  Orders Placed This Encounter  Procedures  . CBC with Differential/Platelet  . Comprehensive metabolic panel  . Lipid panel  . TSH  . Ambulatory referral to Gastroenterology   Meds ordered this encounter  Medications  . ALPRAZolam (XANAX) 0.5 MG tablet    Sig: Take 1 tablet (0.5 mg total) by mouth 2 (two) times daily as needed for anxiety.    Dispense:  30 tablet    Refill:  0  . TRINTELLIX 20 MG TABS tablet    Sig: Take 1 tablet (20 mg total) by mouth daily.    Dispense:  90 tablet    Refill:  3      Lifestyle: Body mass index is 24.13 kg/m. Wt Readings from Last 3 Encounters:  03/13/20 140 lb 9.6 oz (63.8 kg)  03/07/19 133 lb (60.3 kg)  12/21/18 140 lb 12.8 oz (63.9 kg)    Patient Active Problem List   Diagnosis Date Noted  . Dysthymia 03/13/2020  . 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 Premature menopause on HRT 04/11/2014   Health Maintenance  Topic Date Due  . COLONOSCOPY  Never done  . INFLUENZA VACCINE  06/29/2020  . MAMMOGRAM  08/29/2020  . PAP SMEAR-Modifier  08/29/2022  . TETANUS/TDAP  05/17/2028  . HIV Screening  Completed   Immunization History  Administered Date(s) Administered  . Hepatitis A, Adult 05/17/2018  . Influenza,inj,Quad PF,6+ Mos 09/12/2014, 08/30/2017, 09/17/2018  . Influenza-Unspecified 12/16/2016, 08/30/2017  . MMR 05/17/2018  . Tdap 05/17/2018   We updated and reviewed the patient's past history in detail and it is documented below. Allergies: Patient has No Known Allergies. Past Medical History Patient  has a past medical history of Dysthymia, Eczema, and Premature menopause on hormone replacement therapy. Past Surgical History Patient  has a past surgical history that includes  Tonsillectomy; Cesarean section; Wisdom tooth extraction; and Adenoidectomy. Family History: Patient family history includes Chromosomal disorder in her daughter; Healthy in her brother, daughter, father, mother, sister, sister, sister, and son; Heart disease in her paternal grandfather. Social History:  Patient  reports that she has never smoked. She has never used smokeless tobacco. She reports current alcohol use. She reports that she does not use drugs.  Review of Systems: Constitutional: negative for fever or malaise Ophthalmic: negative for photophobia, double vision or loss of vision Cardiovascular: negative for chest pain, dyspnea on exertion, or new LE swelling Respiratory: negative for SOB or persistent cough Gastrointestinal: negative for abdominal pain, change in bowel habits or melena Genitourinary: negative for dysuria or gross hematuria, no abnormal uterine bleeding or disharge Musculoskeletal: negative for new gait disturbance or muscular weakness Integumentary: negative for new or persistent rashes, no breast lumps Neurological: negative for TIA or stroke symptoms Psychiatric: negative for SI or delusions Allergic/Immunologic: negative for hives  Patient Care Team    Relationship Specialty Notifications Start End  Leamon Arnt, MD PCP - General Family Medicine  03/13/20   Bobbye Charleston, MD Consulting Physician Obstetrics and Gynecology  03/13/20     Objective  Vitals: BP 116/80   Pulse (!) 102   Temp 97.9 F (36.6 C) (Temporal)   Resp 15   Ht '5\' 4"'$  (1.626 m)   Wt 140 lb 9.6 oz (63.8 kg)   SpO2 98%   BMI 24.13 kg/m  General:  Well developed, well nourished, no acute distress  Psych:  Alert and orientedx3,normal mood and affect HEENT:  Normocephalic, atraumatic, non-icteric sclera,  supple neck without adenopathy, mass or thyromegaly Cardiovascular:  Normal S1, S2, RRR without gallop, rub or murmur Respiratory:  Good breath sounds bilaterally, CTAB with  normal respiratory effort Gastrointestinal: normal bowel sounds, soft, non-tender, no noted masses. No HSM MSK: no deformities, contusions. Joints are without erythema or swelling.  Skin:  Warm, no rashes or suspicious lesions noted Neurologic:    Mental status is normal.   Normal gait. No tremor    Commons side effects, risks, benefits, and alternatives for medications and treatment plan prescribed today were discussed, and the patient expressed understanding of the given instructions. Patient is instructed to call or message via MyChart if he/she has any questions or concerns regarding our treatment plan. No barriers to understanding were identified. We discussed Red Flag symptoms and signs in detail. Patient expressed understanding regarding what to do in case of urgent or emergency type symptoms.   Medication list was reconciled, printed and provided to the patient in AVS. Patient instructions and summary information was reviewed with the patient as documented in the AVS. This note was prepared with assistance of Dragon voice recognition software. Occasional wrong-word or sound-a-like substitutions may have occurred due to the inherent limitations of voice recognition software  This visit occurred during the SARS-CoV-2 public health emergency.  Safety protocols were in place, including screening questions  prior to the visit, additional usage of staff PPE, and extensive cleaning of exam room while observing appropriate contact time as indicated for disinfecting solutions.

## 2020-03-13 NOTE — Progress Notes (Signed)
Lab results mailed to patient in letter. Normal results. No action / follow up needed on these results.  

## 2020-04-04 ENCOUNTER — Encounter: Payer: Self-pay | Admitting: Internal Medicine

## 2020-05-08 ENCOUNTER — Other Ambulatory Visit: Payer: Self-pay

## 2020-05-08 ENCOUNTER — Ambulatory Visit (AMBULATORY_SURGERY_CENTER): Payer: Self-pay

## 2020-05-08 VITALS — Ht 64.0 in | Wt 143.0 lb

## 2020-05-08 DIAGNOSIS — Z1211 Encounter for screening for malignant neoplasm of colon: Secondary | ICD-10-CM

## 2020-05-08 NOTE — Progress Notes (Signed)
No egg or soy allergy known to patient  No issues with past sedation with any surgeries  or procedures, no intubation problems  No diet pills per patient No home 02 use per patient  No blood thinners per patient  Pt denies issues with constipation  No A fib or A flutter  EMMI video sent to pt's e mail   Pt has been vaccinated for Covid.  Due to the COVID-19 pandemic we are asking patients to follow these guidelines. Please only bring one care partner. Please be aware that your care partner may wait in the car in the parking lot or if they feel like they will be too hot to wait in the car, they may wait in the lobby on the 4th floor. All care partners are required to wear a mask the entire time (we do not have any that we can provide them), they need to practice social distancing, and we will do a Covid check for all patient's and care partners when you arrive. Also we will check their temperature and your temperature. If the care partner waits in their car they need to stay in the parking lot the entire time and we will call them on their cell phone when the patient is ready for discharge so they can bring the car to the front of the building. Also all patient's will need to wear a mask into building.

## 2020-05-15 ENCOUNTER — Encounter: Payer: Self-pay | Admitting: Internal Medicine

## 2020-05-22 ENCOUNTER — Telehealth: Payer: Self-pay | Admitting: Internal Medicine

## 2020-05-22 NOTE — Telephone Encounter (Signed)
OK no charge ?

## 2020-05-22 NOTE — Telephone Encounter (Signed)
Pt called to cancel Colon on Friday @ 11:00  stating she is running a fever and has back pain. She said she would call to reschedule when feeling better.

## 2020-05-23 ENCOUNTER — Encounter: Payer: 59 | Admitting: Internal Medicine

## 2020-05-24 ENCOUNTER — Encounter (HOSPITAL_COMMUNITY): Payer: Self-pay | Admitting: Emergency Medicine

## 2020-05-24 ENCOUNTER — Emergency Department (HOSPITAL_COMMUNITY): Payer: 59

## 2020-05-24 ENCOUNTER — Observation Stay (HOSPITAL_COMMUNITY)
Admission: EM | Admit: 2020-05-24 | Discharge: 2020-05-26 | Disposition: A | Discharge status: Home / Self Care | Payer: 59 | Attending: Internal Medicine | Admitting: Internal Medicine

## 2020-05-24 ENCOUNTER — Ambulatory Visit (HOSPITAL_COMMUNITY)
Admission: EM | Admit: 2020-05-24 | Discharge: 2020-05-24 | Disposition: A | Discharge status: Home / Self Care | Payer: 59 | Source: Home / Self Care

## 2020-05-24 ENCOUNTER — Other Ambulatory Visit: Payer: Self-pay

## 2020-05-24 DIAGNOSIS — R112 Nausea with vomiting, unspecified: Secondary | ICD-10-CM | POA: Diagnosis not present

## 2020-05-24 DIAGNOSIS — L309 Dermatitis, unspecified: Secondary | ICD-10-CM | POA: Insufficient documentation

## 2020-05-24 DIAGNOSIS — M542 Cervicalgia: Secondary | ICD-10-CM | POA: Diagnosis not present

## 2020-05-24 DIAGNOSIS — D696 Thrombocytopenia, unspecified: Secondary | ICD-10-CM | POA: Diagnosis present

## 2020-05-24 DIAGNOSIS — Z79899 Other long term (current) drug therapy: Secondary | ICD-10-CM | POA: Insufficient documentation

## 2020-05-24 DIAGNOSIS — R519 Headache, unspecified: Secondary | ICD-10-CM | POA: Diagnosis not present

## 2020-05-24 DIAGNOSIS — D61818 Other pancytopenia: Secondary | ICD-10-CM | POA: Insufficient documentation

## 2020-05-24 DIAGNOSIS — A7740 Ehrlichiosis, unspecified: Secondary | ICD-10-CM | POA: Diagnosis present

## 2020-05-24 DIAGNOSIS — R7989 Other specified abnormal findings of blood chemistry: Secondary | ICD-10-CM

## 2020-05-24 DIAGNOSIS — D72819 Decreased white blood cell count, unspecified: Secondary | ICD-10-CM | POA: Diagnosis not present

## 2020-05-24 DIAGNOSIS — Z79890 Hormone replacement therapy (postmenopausal): Secondary | ICD-10-CM | POA: Insufficient documentation

## 2020-05-24 DIAGNOSIS — R7401 Elevation of levels of liver transaminase levels: Secondary | ICD-10-CM | POA: Insufficient documentation

## 2020-05-24 DIAGNOSIS — F419 Anxiety disorder, unspecified: Secondary | ICD-10-CM | POA: Insufficient documentation

## 2020-05-24 DIAGNOSIS — R945 Abnormal results of liver function studies: Secondary | ICD-10-CM

## 2020-05-24 DIAGNOSIS — R509 Fever, unspecified: Secondary | ICD-10-CM

## 2020-05-24 DIAGNOSIS — Z20822 Contact with and (suspected) exposure to covid-19: Secondary | ICD-10-CM | POA: Insufficient documentation

## 2020-05-24 LAB — DIC (DISSEMINATED INTRAVASCULAR COAGULATION)PANEL
D-Dimer, Quant: 5.34 ug/mL-FEU — ABNORMAL HIGH (ref 0.00–0.50)
Fibrinogen: 263 mg/dL (ref 210–475)
INR: 1.3 — ABNORMAL HIGH (ref 0.8–1.2)
Platelets: 62 10*3/uL — ABNORMAL LOW (ref 150–400)
Prothrombin Time: 15.5 seconds — ABNORMAL HIGH (ref 11.4–15.2)
Smear Review: NONE SEEN
aPTT: 36 seconds (ref 24–36)

## 2020-05-24 LAB — COMPREHENSIVE METABOLIC PANEL
ALT: 103 U/L — ABNORMAL HIGH (ref 0–44)
AST: 121 U/L — ABNORMAL HIGH (ref 15–41)
Albumin: 3.5 g/dL (ref 3.5–5.0)
Alkaline Phosphatase: 84 U/L (ref 38–126)
Anion gap: 11 (ref 5–15)
BUN: 11 mg/dL (ref 6–20)
CO2: 24 mmol/L (ref 22–32)
Calcium: 8.8 mg/dL — ABNORMAL LOW (ref 8.9–10.3)
Chloride: 101 mmol/L (ref 98–111)
Creatinine, Ser: 0.88 mg/dL (ref 0.44–1.00)
GFR calc Af Amer: >60 mL/min (ref >60)
GFR calc non Af Amer: >60 mL/min (ref >60)
Glucose, Bld: 104 mg/dL — ABNORMAL HIGH (ref 70–99)
Potassium: 3.9 mmol/L (ref 3.5–5.1)
Sodium: 136 mmol/L (ref 135–145)
Total Bilirubin: 2.3 mg/dL — ABNORMAL HIGH (ref 0.3–1.2)
Total Protein: 5.8 g/dL — ABNORMAL LOW (ref 6.5–8.1)

## 2020-05-24 LAB — URINALYSIS, ROUTINE W REFLEX MICROSCOPIC
Bilirubin Urine: NEGATIVE
Glucose, UA: NEGATIVE mg/dL
Hgb urine dipstick: NEGATIVE
Ketones, ur: 80 mg/dL — AB
Leukocytes,Ua: NEGATIVE
Nitrite: NEGATIVE
Protein, ur: 100 mg/dL — AB
Specific Gravity, Urine: 1.035 — ABNORMAL HIGH (ref 1.005–1.030)
pH: 5 (ref 5.0–8.0)

## 2020-05-24 LAB — CSF CELL COUNT WITH DIFFERENTIAL
RBC Count, CSF: 1 /mm3 — ABNORMAL HIGH
RBC Count, CSF: 3 /mm3 — ABNORMAL HIGH
Tube #: 1
Tube #: 4
WBC, CSF: 0 /mm3 (ref 0–5)
WBC, CSF: 1 /mm3 (ref 0–5)

## 2020-05-24 LAB — CBC
HCT: 41.7 % (ref 36.0–46.0)
Hemoglobin: 14.2 g/dL (ref 12.0–15.0)
MCH: 30.3 pg (ref 26.0–34.0)
MCHC: 34.1 g/dL (ref 30.0–36.0)
MCV: 89.1 fL (ref 80.0–100.0)
Platelets: 70 10*3/uL — ABNORMAL LOW (ref 150–400)
RBC: 4.68 MIL/uL (ref 3.87–5.11)
RDW: 12.5 % (ref 11.5–15.5)
WBC: 1.9 10*3/uL — ABNORMAL LOW (ref 4.0–10.5)
nRBC: 0 % (ref 0.0–0.2)

## 2020-05-24 LAB — PROTEIN, CSF: Total  Protein, CSF: 31 mg/dL (ref 15–45)

## 2020-05-24 LAB — LIPASE, BLOOD: Lipase: 24 U/L (ref 11–51)

## 2020-05-24 LAB — LACTIC ACID, PLASMA: Lactic Acid, Venous: 0.9 mmol/L (ref 0.5–1.9)

## 2020-05-24 LAB — GLUCOSE, CSF: Glucose, CSF: 53 mg/dL (ref 40–70)

## 2020-05-24 MED ORDER — SODIUM CHLORIDE 0.9 % IV SOLN: INTRAVENOUS | Status: DC

## 2020-05-24 MED ORDER — LIDOCAINE HCL (PF) 1 % IJ SOLN
5.0000 mL | Freq: Once | INTRAMUSCULAR | Status: DC
  Filled 2020-05-24: qty 5

## 2020-05-24 MED ORDER — ONDANSETRON HCL 4 MG PO TABS: 4.0000 mg | ORAL_TABLET | Freq: Four times a day (QID) | ORAL | Status: DC | PRN

## 2020-05-24 MED ORDER — ONDANSETRON HCL 4 MG/2ML IJ SOLN: 4.0000 mg | Freq: Four times a day (QID) | INTRAMUSCULAR | Status: DC | PRN

## 2020-05-24 MED ORDER — KETOROLAC TROMETHAMINE 30 MG/ML IJ SOLN
30.0000 mg | Freq: Four times a day (QID) | INTRAMUSCULAR | Status: DC | PRN
  Administered 2020-05-25: 30 mg via INTRAVENOUS
  Filled 2020-05-24: qty 1

## 2020-05-24 MED ORDER — SODIUM CHLORIDE 0.9 % IV BOLUS
1000.0000 mL | Freq: Once | INTRAVENOUS | Status: AC
  Administered 2020-05-24: 1000 mL via INTRAVENOUS

## 2020-05-24 MED ORDER — SODIUM CHLORIDE 0.9 % IV SOLN
100.0000 mg | Freq: Two times a day (BID) | INTRAVENOUS | Status: DC
  Administered 2020-05-25 – 2020-05-26 (×3): 100 mg via INTRAVENOUS
  Filled 2020-05-24 (×4): qty 100

## 2020-05-24 MED ORDER — ONDANSETRON HCL 4 MG/2ML IJ SOLN
4.0000 mg | Freq: Once | INTRAMUSCULAR | Status: AC
  Administered 2020-05-24: 4 mg via INTRAVENOUS
  Filled 2020-05-24: qty 2

## 2020-05-24 MED ORDER — SODIUM CHLORIDE 0.9 % IV SOLN
100.0000 mg | Freq: Once | INTRAVENOUS | Status: AC
  Administered 2020-05-24: 100 mg via INTRAVENOUS
  Filled 2020-05-24: qty 100

## 2020-05-24 MED ORDER — SODIUM CHLORIDE 0.9 % IV SOLN
100.0000 mg | Freq: Two times a day (BID) | INTRAVENOUS | Status: DC
  Filled 2020-05-24: qty 100

## 2020-05-24 MED ORDER — ACETAMINOPHEN 650 MG RE SUPP: 650.0000 mg | Freq: Four times a day (QID) | RECTAL | Status: DC | PRN

## 2020-05-24 MED ORDER — ACETAMINOPHEN 325 MG PO TABS
650.0000 mg | ORAL_TABLET | Freq: Four times a day (QID) | ORAL | Status: DC | PRN
  Administered 2020-05-24 – 2020-05-26 (×4): 650 mg via ORAL
  Filled 2020-05-24 (×4): qty 2

## 2020-05-24 MED ORDER — KETOROLAC TROMETHAMINE 15 MG/ML IJ SOLN
15.0000 mg | Freq: Once | INTRAMUSCULAR | Status: AC
  Administered 2020-05-24: 15 mg via INTRAVENOUS
  Filled 2020-05-24: qty 1

## 2020-05-24 MED ORDER — SODIUM CHLORIDE 0.9% FLUSH: 3.0000 mL | Freq: Once | INTRAVENOUS | Status: DC

## 2020-05-24 NOTE — ED Provider Notes (Signed)
[facility] EMERGENCY DEPARTMENT Provider Note   CSN: [id number] Arrival date & time: 05/24/20  1443     History Chief Complaint  Patient presents with  . Headache  . Emesis    Kelly Jensen is a 54 y.o. female.  HPI   53yF with nausea, vomiting, fever, headaches and general malaise. Began feeling poorly ~4 days ago. Headache diffuse and also some pain in neck but neck doesn't feel stiff. No respiratory symptoms. Concerned for possible tick borne illness. No specific bite noted but spends significant time outside in and around wooded areas. No rash. No sick contacts. Husband is a physician and pt had one dose of doxycycline prior to coming.   Past Medical History:  Diagnosis Date  . 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 Dysthymia   . Eczema   . Premature menopause on hormone replacement therapy    early 40s/ Dr. Philis Pique    Patient Active Problem List   Diagnosis Date Noted  . Dysthymia 03/13/2020  . 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 Premature menopause on HRT 04/11/2014    Past Surgical History:  Procedure Laterality Date  . ADENOIDECTOMY    . CESAREAN SECTION    . TONSILLECTOMY    . WISDOM TOOTH EXTRACTION       OB History   No obstetric history on file.     Family History  Problem Relation Age of Onset  . Healthy Mother   . Healthy Father   . Healthy Sister   . Healthy Brother   . Healthy Daughter   . Healthy Son   . Heart disease Paternal Grandfather   . Healthy Sister   . Healthy Sister   . Chromosomal disorder Daughter   . Colon cancer Neg Hx   . Colon polyps Neg Hx   . Esophageal cancer Neg Hx   . Rectal cancer Neg Hx   . Stomach cancer Neg Hx   . Hypertension Neg Hx   . Hyperlipidemia Neg Hx   . Diabetes Neg Hx     Social History   Tobacco Use  . Smoking status: Never Smoker  . Smokeless tobacco: Never Used  Vaping Use  . Vaping Use: Never used  Substance Use Topics  . Alcohol use: Yes    Alcohol/week: 2.0 standard  drinks    Types: 2 Glasses of wine per week  . Drug use: Never    Home Medications Prior to Admission medications   Medication Sig Start Date End Date Taking? Authorizing Provider  ALPRAZolam Duanne Moron) 0.5 MG tablet Take 1 tablet (0.5 mg total) by mouth 2 (two) times daily as needed for anxiety. 03/13/20   Leamon Arnt, MD  clobetasol ointment (TEMOVATE) 0.05 % clobetasol 0.05 % topical ointment    [provider]  Estradiol-Estriol-Progesterone (BIEST/PROGESTERONE) CREA Place onto the skin. 2 clicks a day    [provider]  progesterone (PROMETRIUM) 200 MG capsule TAKE 1 CAPSULE BY MOUTH EVERY DAY 05/07/19   Briscoe Deutscher, DO  TRINTELLIX 20 MG TABS tablet Take 1 tablet (20 mg total) by mouth daily. 03/13/20   Leamon Arnt, MD    Allergies    Patient has no known allergies.  Review of Systems   Review of Systems All systems reviewed and negative, other than as noted in HPI.  Physical Exam Updated Vital Signs BP 101/67 (BP Location: Right Arm)   Pulse (!) 101   Temp 98.9 F (37.2 C) (Oral)  Resp 14   SpO2 100%   Physical Exam Vitals and nursing note reviewed.  Constitutional:      General: She is not in acute distress.    Appearance: She is well-developed.  HENT:     Head: Normocephalic and atraumatic.  Eyes:     General:        Right eye: No discharge.        Left eye: No discharge.     Conjunctiva/sclera: Conjunctivae normal.  Cardiovascular:     Rate and Rhythm: Regular rhythm. Tachycardia present.     Heart sounds: Normal heart sounds. No murmur heard.  No friction rub. No gallop.   Pulmonary:     Effort: Pulmonary effort is normal. No respiratory distress.     Breath sounds: Normal breath sounds.  Abdominal:     General: There is no distension.     Palpations: Abdomen is soft.     Tenderness: There is no abdominal tenderness.  Musculoskeletal:        General: No tenderness.     Cervical back: Neck supple. No rigidity.  Skin:     General: Skin is warm and dry.     Findings: No rash.  Neurological:     Mental Status: She is alert.  Psychiatric:        Behavior: Behavior normal.        Thought Content: Thought content normal.     ED Results / Procedures / Treatments   Labs (all labs ordered are listed, but only abnormal results are displayed) Labs Reviewed  COMPREHENSIVE METABOLIC PANEL - Abnormal; Notable for the following components:      Result Value   Glucose, Bld 104 (*)    Calcium 8.8 (*)    Total Protein 5.8 (*)    AST 121 (*)    ALT 103 (*)    Total Bilirubin 2.3 (*)    All other components within normal limits  CBC - Abnormal; Notable for the following components:   WBC 1.9 (*)    Platelets 70 (*)    All other components within normal limits  URINALYSIS, ROUTINE W REFLEX MICROSCOPIC - Abnormal; Notable for the following components:   Color, Urine AMBER (*)    APPearance HAZY (*)    Specific Gravity, Urine 1.035 (*)    Ketones, ur 80 (*)    Protein, ur 100 (*)    Bacteria, UA FEW (*)    All other components within normal limits  CSF CELL COUNT WITH DIFFERENTIAL - Abnormal; Notable for the following components:   RBC Count, CSF 1 (*)    All other components within normal limits  CSF CELL COUNT WITH DIFFERENTIAL - Abnormal; Notable for the following components:   RBC Count, CSF 3 (*)    All other components within normal limits  DIC (DISSEMINATED INTRAVASCULAR COAGULATION) PANEL - Abnormal; Notable for the following components:   Prothrombin Time 15.5 (*)    INR 1.3 (*)    D-Dimer, Quant 5.34 (*)    Platelets 62 (*)    All other components within normal limits  CBC - Abnormal; Notable for the following components:   WBC 1.2 (*)    RBC 3.76 (*)    Hemoglobin 11.5 (*)    HCT 33.0 (*)    Platelets 60 (*)    All other components within normal limits  COMPREHENSIVE METABOLIC PANEL - Abnormal; Notable for the following components:   Sodium 134 (*)    Potassium 3.3 (*)  CO2 20 (*)     Calcium 7.6 (*)    Total Protein 4.7 (*)    Albumin 2.7 (*)    AST 112 (*)    ALT 88 (*)    Total Bilirubin 2.1 (*)    All other components within normal limits  CULTURE, BLOOD (ROUTINE X 2)  CULTURE, BLOOD (ROUTINE X 2)  CSF CULTURE  SARS CORONAVIRUS 2 BY RT PCR (HOSPITAL ORDER, PERFORMED IN [facility])  LIPASE, BLOOD  GLUCOSE, CSF  PROTEIN, CSF  LACTIC ACID, PLASMA  LACTIC ACID, PLASMA  HIV ANTIBODY (ROUTINE TESTING W REFLEX)  HERPES SIMPLEX VIRUS(HSV) DNA BY PCR  ARBOVIRUS IGG, CSF  ROCKY MTN SPOTTED FVR ABS PNL(IGG+IGM)  EHRLICHIA ANTIBODY PANEL  MISC [facility] TEST (SEND OUT)  MISC [facility] TEST (SEND OUT)  MISC [facility] TEST (SEND OUT)  MISC [facility] TEST (SEND OUT)  ROCKY MTN SPOTTED FVR ABS PNL(IGG+IGM)    EKG None  Radiology DG CHEST PORT 1 VIEW  Result Date: 05/24/2020 CLINICAL DATA:  Fever EXAM: PORTABLE CHEST 1 VIEW COMPARISON:  None. FINDINGS: Heart and mediastinal contours are within normal limits. No focal opacities or effusions. No acute bony abnormality. IMPRESSION: No active disease. Electronically Signed   By: Charlett Nose M.D.   On: 05/24/2020 20:55    Procedures .Lumbar Puncture  Date/Time: 05/24/2020 8:00 PM Performed by: Raeford Razor, MD Authorized by: Raeford Razor, MD   Consent:    Consent obtained:  Verbal   Consent given by:  Patient   Risks discussed:  Bleeding, infection, pain and headache Pre-procedure details:    Procedure purpose:  Diagnostic   Preparation: Patient was prepped and draped in usual sterile fashion   Anesthesia (see MAR for exact dosages):    Anesthesia method:  Local infiltration   Local anesthetic:  Lidocaine 1% w/o epi Procedure details:    Lumbar space:  L3-L4 interspace   Patient position:  L lateral decubitus   Needle gauge:  20   Needle type:  Spinal needle - Quincke tip   Needle length (in):  3.5   Ultrasound guidance: no     Number of attempts:  1   Fluid appearance:  Clear   Tubes of  fluid:  4   Total volume (ml):  5 Post-procedure:    Puncture site:  Adhesive bandage applied   Patient tolerance of procedure:  Tolerated well, no immediate complications   (including critical care time)   Medications Ordered in ED Medications  sodium chloride flush (NS) 0.9 % injection 3 mL (3 mLs Intravenous Not Given 05/24/20 1717)  lidocaine (PF) (XYLOCAINE) 1 % injection 5 mL (has no administration in time range)  doxycycline (VIBRAMYCIN) 100 mg in sodium chloride 0.9 % 250 mL IVPB (100 mg Intravenous New Bag/Given 05/24/20 1921)  doxycycline (VIBRAMYCIN) 100 mg in sodium chloride 0.9 % 250 mL IVPB (has no administration in time range)  0.9 %  sodium chloride infusion (has no administration in time range)  ondansetron (ZOFRAN) tablet 4 mg (has no administration in time range)    Or  ondansetron (ZOFRAN) injection 4 mg (has no administration in time range)  acetaminophen (TYLENOL) tablet 650 mg (has no administration in time range)    Or  acetaminophen (TYLENOL) suppository 650 mg (has no administration in time range)  ketorolac (TORADOL) 30 MG/ML injection 30 mg (has no administration in time range)  ketorolac (TORADOL) 15 MG/ML injection 15 mg (15 mg Intravenous Given 05/24/20 1927)  sodium chloride 0.9 %  bolus 1,000 mL (0 mLs Intravenous Stopped 05/24/20 2041)  ondansetron (ZOFRAN) injection 4 mg (4 mg Intravenous Given 05/24/20 1927)    ED Course  I have reviewed the triage vital signs and the nursing notes.  Pertinent labs & imaging results that were available during my care of the patient were reviewed by me and considered in my medical decision making (see chart for details).    MDM Rules/Calculators/A&P                          53yF with headache, nausea/vomiting and fever. Some concern for RMSF. No clear tick exposure but does a lot of walking/hiking outdoors. No rash but day 3-4 since symptoms began so may be a little early. Not hyponatremic. She is thrombocytopenic  though which is new from labs just a couple months ago. Also leukopenic and has elevation in LFTs.  Aside from rickettsial disease, other bacterial meningitis is a consideration although I think less likely.  Viral/aseptic meningitis is also possibility. Has HA and some neck pain but no meningismus on exam. Neuro exam is nonfocal. She is very lucid. I don't see a pressing need for neuroimaging prior to LP. Doxycycline ordered. Would have liked to have done LP prior to abx. Husband is a physician though and she has already had a dose of doxycycline.   Final Clinical Impression(s) / ED Diagnoses Final diagnoses:  Fever, unspecified  Nausea and vomiting, intractability of vomiting not specified, unspecified vomiting type  Nonintractable headache, unspecified chronicity pattern, unspecified headache type  Thrombocytopenia (HCC)  Leukopenia, unspecified type  Abnormal LFTs    Rx / DC Orders ED Discharge Orders    None       Raeford Razor, MD 05/25/20 1535

## 2020-05-24 NOTE — ED Triage Notes (Signed)
Pt c/o headache x 4 days, generalized weakness and nausea/vomiting that started 24 hours ago. Pt's husband concerned she has a tick-borne illness, pt reports finding no ticks on her body or rashes.

## 2020-05-24 NOTE — H&P (Signed)
History and Physical    [name]:[id number] DOB: 1966/01/20 DOA: 05/24/2020  PCP: Willow Ora, MD  Patient coming from: Home  I have personally briefly reviewed patient's old medical records in [facility]  Chief Complaint: Headache  HPI: Kelly Jensen is a 54 y.o. female with medical history significant of eczema.  Pt presents to ED with 4 day h/o headache, generalized weakness, N/V, malaise.  Pt denies any URI symptoms, respiratory symptoms, cough, congestion, rash, dysuria.  Headache was severe, but no meningismus.  Pt goes outdoors in the woods a lot, large number of ticks in their area.  Pt's husband, who is a physician, is suspicious of a tick borne illness, though no specific tick bite identified.  Pt husband gave her 100mg  doxycycline before coming in this evening.   ED Course: In the ED pt with Tm 99.3, HR 113, BP 90/67.  WBC 1.9k, Platelets 70, AST 121, ALT 103.  UA unremarkable  COVID test (both rapid and PCR) as outpt have come back negative.  Pt did have J&J COVID vaccine in March.  CXR neg.  LP performed by EDP and results pending.  PT put on doxycycline IV and given 1L NS bolus.   Review of Systems: As per HPI, otherwise all review of systems negative.  Past Medical History:  Diagnosis Date  . 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 Dysthymia   . Eczema   . Premature menopause on hormone replacement therapy    early 40s/ Dr. April    Past Surgical History:  Procedure Laterality Date  . ADENOIDECTOMY    . CESAREAN SECTION    . TONSILLECTOMY    . WISDOM TOOTH EXTRACTION       reports that she has never smoked. She has never used smokeless tobacco. She reports current alcohol use of about 2.0 standard drinks of alcohol per week. She reports that she does not use drugs.  No Known Allergies  Family History  Problem Relation Age of Onset  . Healthy Mother   . Healthy Father   . Healthy Sister   . Healthy Brother   .  Healthy Daughter   . Healthy Son   . Heart disease Paternal Grandfather   . Healthy Sister   . Healthy Sister   . Chromosomal disorder Daughter   . Colon cancer Neg Hx   . Colon polyps Neg Hx   . Esophageal cancer Neg Hx   . Rectal cancer Neg Hx   . Stomach cancer Neg Hx   . Hypertension Neg Hx   . Hyperlipidemia Neg Hx   . Diabetes Neg Hx      Prior to Admission medications   Medication Sig Start Date End Date Taking? Authorizing Provider  acetaminophen (TYLENOL) 500 MG tablet Take 1,000 mg by mouth every 6 (six) hours as needed for headache (pain).   Yes [provider]  ALPRAZolam (XANAX) 0.5 MG tablet Take 1 tablet (0.5 mg total) by mouth 2 (two) times daily as needed for anxiety. Patient taking differently: Take 0.5 mg by mouth at bedtime as needed for anxiety or sleep.  03/13/20  Yes 03/15/20, MD  clobetasol ointment (TEMOVATE) 0.05 % Apply 1 application topically daily as needed (eczema).    Yes [provider]  doxycycline (MONODOX) 100 MG capsule Take 100 mg by mouth 2 (two) times daily. 05/24/20  Yes [provider]  naproxen sodium (ALEVE) 220 MG tablet Take 440 mg  by mouth 2 (two) times daily as needed (pain/headache).   Yes [provider]  PRESCRIPTION MEDICATION Apply 1 application topically daily. Testosterone/progesterone cream compounded at [place] - prescribed by Dr Bobbye Charleston   Yes [provider]  progesterone (PROMETRIUM) 200 MG capsule TAKE 1 [place] Patient taking differently: Take 200 mg by mouth at bedtime.  05/07/19  Yes Briscoe Deutscher, DO  TRINTELLIX 20 MG TABS tablet Take 1 tablet (20 mg total) by mouth daily. Patient taking differently: Take 20 mg by mouth at bedtime.  03/13/20  Yes Leamon Arnt, MD  nitrofurantoin, macrocrystal-monohydrate, (MACROBID) 100 MG capsule Take 100 mg by mouth daily. 05/24/20   [provider]  promethazine (PHENERGAN) 12.5 MG tablet  Take 12.5 mg by mouth every 8 (eight) hours as needed for nausea or vomiting.  05/24/20   [provider]    Physical Exam: Vitals:   05/24/20 1459 05/24/20 1702 05/24/20 1924 05/24/20 2030  BP: 94/67 101/67 100/76 90/67  Pulse: (!) 113 (!) 101 (!) 110 98  Resp: 16 14 (!) 24 (!) 25  Temp: 99.3 F (37.4 C) 98.9 F (37.2 C)    TempSrc: Oral Oral    SpO2: 99% 100% 97% 95%    Constitutional: NAD, calm, comfortable Eyes: PERRL, lids and conjunctivae normal ENMT: Mucous membranes are moist. Posterior pharynx clear of any exudate or lesions.Normal dentition.  Neck: normal, supple, no masses, no thyromegaly Respiratory: clear to auscultation bilaterally, no wheezing, no crackles. Normal respiratory effort. No accessory muscle use.  Cardiovascular: Regular rate and rhythm, no murmurs / rubs / gallops. No extremity edema. 2+ pedal pulses. No carotid bruits.  Abdomen: no tenderness, no masses palpated. No hepatosplenomegaly. Bowel sounds positive.  Musculoskeletal: no clubbing / cyanosis. No joint deformity upper and lower extremities. Good ROM, no contractures. Normal muscle tone.  Skin: no rashes, lesions, ulcers. No induration Neurologic: CN 2-12 grossly intact. Sensation intact, DTR normal. Strength 5/5 in all 4.  Psychiatric: Normal judgment and insight. Alert and oriented x 3. Normal mood.    Labs on Admission: I have personally reviewed following labs and imaging studies  CBC: Recent Labs  Lab 05/24/20 1544  WBC 1.9*  HGB 14.2  HCT 41.7  MCV 89.1  PLT 70*   Basic Metabolic Panel: Recent Labs  Lab 05/24/20 1544  NA 136  K 3.9  CL 101  CO2 24  GLUCOSE 104*  BUN 11  CREATININE 0.88  CALCIUM 8.8*   GFR: CrCl cannot be calculated (Unknown ideal weight.). Liver Function Tests: Recent Labs  Lab 05/24/20 1544  AST 121*  ALT 103*  ALKPHOS 84  BILITOT 2.3*  PROT 5.8*  ALBUMIN 3.5   Recent Labs  Lab 05/24/20 1544  LIPASE 24   No results for input(s):  AMMONIA in the last 168 hours. Coagulation Profile: No results for input(s): INR, PROTIME in the last 168 hours. Cardiac Enzymes: No results for input(s): CKTOTAL, CKMB, CKMBINDEX, TROPONINI in the last 168 hours. BNP (last 3 results) No results for input(s): PROBNP in the last 8760 hours. HbA1C: No results for input(s): HGBA1C in the last 72 hours. CBG: No results for input(s): GLUCAP in the last 168 hours. Lipid Profile: No results for input(s): CHOL, HDL, LDLCALC, TRIG, CHOLHDL, LDLDIRECT in the last 72 hours. Thyroid Function Tests: No results for input(s): TSH, T4TOTAL, FREET4, T3FREE, THYROIDAB in the last 72 hours. Anemia Panel: No results for input(s): VITAMINB12, FOLATE, FERRITIN, TIBC, IRON, RETICCTPCT in  the last 72 hours. Urine analysis:    Component Value Date/Time   COLORURINE AMBER (A) 05/24/2020 1550   APPEARANCEUR HAZY (A) 05/24/2020 1550   LABSPEC 1.035 (H) 05/24/2020 1550   PHURINE 5.0 05/24/2020 1550   GLUCOSEU NEGATIVE 05/24/2020 1550   HGBUR NEGATIVE 05/24/2020 1550   BILIRUBINUR NEGATIVE 05/24/2020 1550   KETONESUR 80 (A) 05/24/2020 1550   PROTEINUR 100 (A) 05/24/2020 1550   NITRITE NEGATIVE 05/24/2020 1550   LEUKOCYTESUR NEGATIVE 05/24/2020 1550    Radiological Exams on Admission: DG CHEST PORT 1 VIEW  Result Date: 05/24/2020 CLINICAL DATA:  Fever EXAM: PORTABLE CHEST 1 VIEW COMPARISON:  None. FINDINGS: Heart and mediastinal contours are within normal limits. No focal opacities or effusions. No acute bony abnormality. IMPRESSION: No active disease. Electronically Signed   By: Charlett Nose M.D.   On: 05/24/2020 20:55    EKG: Independently reviewed.  Assessment/Plan Principal Problem:   Ehrlichiosis Active Problems:   Leukopenia   Thrombocytopenia (HCC)   Headache    1. Acute illness - 1. Given reported suspicion of tick borne illness, presentation, lab findings.  Patient has an almost picture perfect presentation for Human Ehrlichiosis.  RMSF  also possible but no Rash and leukopenia make this slightly less likely. 2. Broad differential however with other tick borne and non-tick borne illnesses possible, few things to note however: 1. No meningismus 2. No respiratory symptoms or findings 3. COVID neg as outpt (x2 tests). 4. UA neg 3. At this point, will continue doxycycline for coverage of ehrlichiosis, RMSF and other tick borne illnesses. 4. CSF studies from LP done in ED are pending still 5. IVF: 1L bolus in ED and NS at 125 cc/hr 6. Holding off on ABx other than doxycycline for the moment, but put pt on broad spectrum ABx if CSF studies come back worrisome OR pt deteriorates clinically in the next couple of hours. 7. Checking DIC panel 8. RMSF and ehrlichia panel ordered and pending 9. Likely consult ID in AM  DVT prophylaxis: SCDs Code Status: Full Family Communication: No family in room Disposition Plan: Home after diagnosis and treatment of illness Consults called: None Admission status: Admit to inpatient  Severity of Illness: The appropriate patient status for this patient is INPATIENT. Inpatient status is judged to be reasonable and necessary in order to provide the required intensity of service to ensure the patient's safety. The patient's presenting symptoms, physical exam findings, and initial radiographic and laboratory data in the context of their chronic comorbidities is felt to place them at high risk for further clinical deterioration. Furthermore, it is not anticipated that the patient will be medically stable for discharge from the hospital within 2 midnights of admission. The following factors support the patient status of inpatient.   IP status due to acute illness with thrombocytopenia, leukopenia, concern for tick-borne illness vs other.  * I certify that at the point of admission it is my clinical judgment that the patient will require inpatient hospital care spanning beyond 2 midnights from the point of  admission due to high intensity of service, high risk for further deterioration and high frequency of surveillance required.*    Tyannah Sane M. DO [facility]  How to contact the [facility] Attending or Consulting provider 7A - 7P or covering provider during after hours 7P -7A, for this patient?  1. Check the care team in [facility] and look for a) attending/consulting [facility] provider listed and b) the [facility] team listed 2. Log into [email or web address]  Am[employer] Physician Scheduling and messaging for groups and whole hospitals  On call and physician scheduling software for group practices, residents, hospitalists and other medical providers for call, clinic, rotation and shift schedules. On[employer] is a hospital-wide system for scheduling doctors and paging doctors on call. EasyPlot is for scientific plotting and data analysis.  [email or web address]  and use [facility]'s universal password to access. If you do not have the password, please contact the hospital operator.  3. Locate the [facility] provider you are looking for under [facility] and page to a number that you can be directly reached. 4. If you still have difficulty reaching the provider, please page the [facility] (Director on Call) for the Hospitalists listed on amion for assistance.  05/24/2020, 9:03 PM

## 2020-05-24 NOTE — ED Notes (Signed)
Per [facility], pt access; pt's husband was requesting that the pt go "straight back to a tx room immediately" prior to registration. When pt's husband was advised that it would not be long for pt to be seen but that they had to wait until the patients that had arrived prior to his wife would be seen first, pt/husband left immediately after being registered bc "they didn't want to wait".

## 2020-05-24 NOTE — ED Notes (Signed)
Pt covid tested, negative result.

## 2020-05-25 ENCOUNTER — Inpatient Hospital Stay (HOSPITAL_COMMUNITY): Payer: 59

## 2020-05-25 DIAGNOSIS — A7740 Ehrlichiosis, unspecified: Secondary | ICD-10-CM | POA: Diagnosis not present

## 2020-05-25 LAB — HIV ANTIBODY (ROUTINE TESTING W REFLEX): HIV Screen 4th Generation wRfx: NONREACTIVE

## 2020-05-25 LAB — CBC
HCT: 33 % — ABNORMAL LOW (ref 36.0–46.0)
Hemoglobin: 11.5 g/dL — ABNORMAL LOW (ref 12.0–15.0)
MCH: 30.6 pg (ref 26.0–34.0)
MCHC: 34.8 g/dL (ref 30.0–36.0)
MCV: 87.8 fL (ref 80.0–100.0)
Platelets: 60 10*3/uL — ABNORMAL LOW (ref 150–400)
RBC: 3.76 MIL/uL — ABNORMAL LOW (ref 3.87–5.11)
RDW: 12.6 % (ref 11.5–15.5)
WBC: 1.2 10*3/uL — CL (ref 4.0–10.5)
nRBC: 0 % (ref 0.0–0.2)

## 2020-05-25 LAB — COMPREHENSIVE METABOLIC PANEL
ALT: 88 U/L — ABNORMAL HIGH (ref 0–44)
AST: 112 U/L — ABNORMAL HIGH (ref 15–41)
Albumin: 2.7 g/dL — ABNORMAL LOW (ref 3.5–5.0)
Alkaline Phosphatase: 62 U/L (ref 38–126)
Anion gap: 10 (ref 5–15)
BUN: 8 mg/dL (ref 6–20)
CO2: 20 mmol/L — ABNORMAL LOW (ref 22–32)
Calcium: 7.6 mg/dL — ABNORMAL LOW (ref 8.9–10.3)
Chloride: 104 mmol/L (ref 98–111)
Creatinine, Ser: 0.81 mg/dL (ref 0.44–1.00)
GFR calc Af Amer: >60 mL/min (ref >60)
GFR calc non Af Amer: >60 mL/min (ref >60)
Glucose, Bld: 87 mg/dL (ref 70–99)
Potassium: 3.3 mmol/L — ABNORMAL LOW (ref 3.5–5.1)
Sodium: 134 mmol/L — ABNORMAL LOW (ref 135–145)
Total Bilirubin: 2.1 mg/dL — ABNORMAL HIGH (ref 0.3–1.2)
Total Protein: 4.7 g/dL — ABNORMAL LOW (ref 6.5–8.1)

## 2020-05-25 LAB — LACTIC ACID, PLASMA: Lactic Acid, Venous: 0.8 mmol/L (ref 0.5–1.9)

## 2020-05-25 LAB — SARS CORONAVIRUS 2 BY RT PCR (HOSPITAL ORDER, PERFORMED IN ~~LOC~~ HOSPITAL LAB): SARS Coronavirus 2: NEGATIVE

## 2020-05-25 MED ORDER — METHOCARBAMOL 1000 MG/10ML IJ SOLN
500.0000 mg | Freq: Four times a day (QID) | INTRAVENOUS | Status: DC | PRN
  Administered 2020-05-25: 500 mg via INTRAVENOUS
  Filled 2020-05-25: qty 5

## 2020-05-25 NOTE — Consult Note (Signed)
[facility]  Total days of antibiotics 2/doxycycline       Reason for Consult: possible tickbite illness   Referring Physician: swayze  Principal Problem:   Ehrlichiosis Active Problems:   Leukopenia   Thrombocytopenia (HCC)   Headache   Transaminitis    HPI: Kelly Jensen is a 54 y.o. female with no significant past medical history who was admitted on 6/26 with four day history fo severe headache, weakness, and n/v, and drenching nightsweats/fevers. She does spend time outdoors, has ticks in the past but none recently. She was started with one dose of doxy per her husband prior to coming to the ED. On admit, she had numerous lab abnormalities including: leukopenia, thrombocytopenia, mild transamnitis in the 80-100s. Due to severe headache, with right sided occipital pain, she underwent LP - CSF evaluation showed no WBC. She was started on IV doxycycline due to concern for tickborne illness such as RMSF and erhlichiosis. ID asked to weigh in on management. She feels somewhat better from gi standpoint, less n/v since taking anti-emetics. Severe occipital headache is intermittent, sharp/severe in nature -still ongoing.  Past Medical History:  Diagnosis Date  . 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 Dysthymia   . Eczema   . Premature menopause on hormone replacement therapy    early 40s/ Dr. Henderson CloudHorvath    Allergies: No Known Allergies  Current antibiotics:   MEDICATIONS: . lidocaine (PF)  5 mL Other Once  . sodium chloride flush  3 mL Intravenous Once    Social History   Tobacco Use  . Smoking status: Never Smoker  . Smokeless tobacco: Never Used  Vaping Use  . Vaping Use: Never used  Substance Use Topics  . Alcohol use: Yes    Alcohol/week: 2.0 standard drinks    Types: 2 Glasses of wine per week  . Drug use: Never    Family History  Problem Relation Age of Onset  . Healthy Mother   . Healthy Father   . Healthy Sister   . Healthy  Brother   . Healthy Daughter   . Healthy Son   . Heart disease Paternal Grandfather   . Healthy Sister   . Healthy Sister   . Chromosomal disorder Daughter   . Colon cancer Neg Hx   . Colon polyps Neg Hx   . Esophageal cancer Neg Hx   . Rectal cancer Neg Hx   . Stomach cancer Neg Hx   . Hypertension Neg Hx   . Hyperlipidemia Neg Hx   . Diabetes Neg Hx     Review of Systems -  Constitutional: + for fever, chills, diaphoresis, activity change, appetite change, fatigue and unexpected weight change.  HENT: Negative for congestion, sore throat, rhinorrhea, sneezing, trouble swallowing and sinus pressure.  Eyes: Negative for photophobia and visual disturbance.  Respiratory: Negative for cough, chest tightness, shortness of breath, wheezing and stridor.  Cardiovascular: Negative for chest pain, palpitations and leg swelling.  Gastrointestinal: +for nausea, vomiting, but negative abdominal pain, diarrhea, constipation, blood in stool, abdominal distention and anal bleeding.  Genitourinary: Negative for dysuria, hematuria, flank pain and difficulty urinating.  Musculoskeletal: Negative for myalgias, back pain, joint swelling, arthralgias and gait problem.  Skin: Negative for color change, pallor, rash and wound.  Neurological: + headache. Negative for dizziness, tremors, weakness and light-headedness.  Hematological: Negative for adenopathy. Does not bruise/bleed easily.  Psychiatric/Behavioral: Negative for behavioral problems, confusion, sleep disturbance, dysphoric mood, decreased  concentration and agitation.      OBJECTIVE: Temp:  [98.7 F (37.1 C)-99.3 F (37.4 C)] 98.7 F (37.1 C) (06/27 0119) Pulse Rate:  [87-114] 87 (06/27 0914) Resp:  [14-25] 16 (06/27 0914) BP: (86-106)/(60-78) 90/63 (06/27 0914) SpO2:  [95 %-100 %] 99 % (06/27 0914) Weight:  [67.9 kg] 67.9 kg (06/27 0119) Physical Exam  Constitutional:  oriented to person, place, and time. appears well-developed and  well-nourished. No distress.  HENT: [place]/AT, PERRLA, no scleral icterus Base of neck = no imbedded tick, tight SCM  Mouth/Throat: Oropharynx is clear and moist. No oropharyngeal exudate.  Chest wall = no rash Cardiovascular: Normal rate, regular rhythm and normal heart sounds. Exam reveals no gallop and no friction rub.  Back = no signs of rash Pulmonary/Chest: Effort normal and breath sounds normal. No respiratory distress.  has no wheezes.  Neck = supple, no nuchal rigidity Lymphadenopathy: no cervical adenopathy. No axillary adenopathy Neurological: alert and oriented to person, place, and time.  Skin: Skin is warm and dry. No rash noted. No erythema.  Psychiatric: a normal mood and affect.  behavior is normal.    LABS: Results for orders placed or performed during the hospital encounter of 05/24/20 (from the past 48 hour(s))  Lipase, blood     Status: None   Collection Time: 05/24/20  3:44 PM  Result Value Ref Range   Lipase 24 11 - 51 U/L    Comment: Performed at [facility], [address], [place], [place] [address]  Comprehensive metabolic panel     Status: Abnormal   Collection Time: 05/24/20  3:44 PM  Result Value Ref Range   Sodium 136 135 - 145 mmol/L   Potassium 3.9 3.5 - 5.1 mmol/L   Chloride 101 98 - 111 mmol/L   CO2 24 22 - 32 mmol/L   Glucose, Bld 104 (H) 70 - 99 mg/dL    Comment: Glucose reference range applies only to samples taken after fasting for at least 8 hours.   BUN 11 6 - 20 mg/dL   Creatinine, Ser 0.81 0.44 - 1.00 mg/dL   Calcium 8.8 (L) 8.9 - 10.3 mg/dL   Total Protein 5.8 (L) 6.5 - 8.1 g/dL   Albumin 3.5 3.5 - 5.0 g/dL   AST 448 (H) 15 - 41 U/L   ALT 103 (H) 0 - 44 U/L   Alkaline Phosphatase 84 38 - 126 U/L   Total Bilirubin 2.3 (H) 0.3 - 1.2 mg/dL   GFR calc non Af Amer >60 >60 mL/min   GFR calc Af Amer >60 >60 mL/min   Anion gap 11 5 - 15    Comment: Performed at [facility], [address] [place], [place] [address]  CBC      Status: Abnormal   Collection Time: 05/24/20  3:44 PM  Result Value Ref Range   WBC 1.9 (L) 4.0 - 10.5 K/uL   RBC 4.68 3.87 - 5.11 MIL/uL   Hemoglobin 14.2 12.0 - 15.0 g/dL   HCT 14.9 36 - 46 %   MCV 89.1 80.0 - 100.0 fL   MCH 30.3 26.0 - 34.0 pg   MCHC 34.1 30.0 - 36.0 g/dL   RDW 70.2 63.7 - 85.8 %   Platelets 70 (L) 150 - 400 K/uL    Comment: SPECIMEN CHECKED FOR CLOTS Immature Platelet Fraction may be clinically indicated, consider ordering this additional test [id number] PLATELET COUNT CONFIRMED BY SMEAR REPEATED TO VERIFY    nRBC 0.0  0.0 - 0.2 %    Comment: Performed at [facility], [address], [place], [place] [address]  Urinalysis, Routine w reflex microscopic     Status: Abnormal   Collection Time: 05/24/20  3:50 PM  Result Value Ref Range   Color, Urine AMBER (A) YELLOW    Comment: BIOCHEMICALS MAY BE AFFECTED BY COLOR   APPearance HAZY (A) CLEAR   Specific Gravity, Urine 1.035 (H) 1.005 - 1.030   pH 5.0 5.0 - 8.0   Glucose, UA NEGATIVE NEGATIVE mg/dL   Hgb urine dipstick NEGATIVE NEGATIVE   Bilirubin Urine NEGATIVE NEGATIVE   Ketones, ur 80 (A) NEGATIVE mg/dL   Protein, ur 741 (A) NEGATIVE mg/dL   Nitrite NEGATIVE NEGATIVE   Leukocytes,Ua NEGATIVE NEGATIVE   RBC / HPF 0-5 0 - 5 RBC/hpf   WBC, UA 0-5 0 - 5 WBC/hpf   Bacteria, UA FEW (A) NONE SEEN   Squamous Epithelial / LPF 0-5 0 - 5   Mucus PRESENT     Comment: Performed at [facility], [address] [place], [place] [address]  Blood culture (routine x 2)     Status: None (Preliminary result)   Collection Time: 05/24/20  5:45 PM   Specimen: BLOOD  Result Value Ref Range   Specimen Description BLOOD SITE NOT SPECIFIED    Special Requests      BOTTLES DRAWN AEROBIC AND ANAEROBIC Blood Culture results may not be optimal due to an inadequate volume of blood received in culture bottles   Culture      NO GROWTH < 12 HOURS Performed at [facility], [address] [place], [place]  [address]    Report Status PENDING   Blood culture (routine x 2)     Status: None (Preliminary result)   Collection Time: 05/24/20  5:50 PM   Specimen: BLOOD  Result Value Ref Range   Specimen Description BLOOD SITE NOT SPECIFIED    Special Requests      BOTTLES DRAWN AEROBIC AND ANAEROBIC Blood Culture results may not be optimal due to an excessive volume of blood received in culture bottles   Culture      NO GROWTH < 12 HOURS Performed at [facility], [address], [place], [place] [address]    Report Status PENDING   DIC (disseminated intravasc coag) panel     Status: Abnormal   Collection Time: 05/24/20  9:47 PM  Result Value Ref Range   Prothrombin Time 15.5 (H) 11.4 - 15.2 seconds   INR 1.3 (H) 0.8 - 1.2    Comment: (NOTE) INR goal varies based on device and disease states.    aPTT 36 24 - 36 seconds   Fibrinogen 263 210 - 475 mg/dL   D-Dimer, Quant 6.28 (H) 0.00 - 0.50 ug/mL-FEU    Comment: (NOTE) At the manufacturer cut-off of 0.50 ug/mL FEU, this assay has been documented to exclude PE with a sensitivity and negative predictive value of 97 to 99%.  At this time, this assay has not been approved by the FDA to exclude DVT/VTE. Results should be correlated with clinical presentation.    Platelets 62 (L) 150 - 400 K/uL    Comment: CONSISTENT WITH PREVIOUS RESULT Immature Platelet Fraction may be clinically indicated, consider ordering this additional test [id number]    Smear Review NO SCHISTOCYTES SEEN     Comment: Performed at [facility], [address] [place], [place] [address]  CSF  cell count with differential collection tube #: 1     Status: Abnormal   Collection Time: 05/24/20  9:53 PM  Result Value Ref Range   Tube # 1    Color, CSF COLORLESS COLORLESS   Appearance, CSF CLEAR CLEAR   Supernatant NOT INDICATED    RBC Count, CSF 1 (H) 0 /cu mm   WBC, CSF 1 0 - 5 /cu mm   Segmented Neutrophils-CSF RARE 0 - 6 %   Lymphs, CSF OCCASIONAL 40 - 80 %    Monocyte-Macrophage-Spinal Fluid FEW 15 - 45 %   Other Cells, CSF TOO FEW TO COUNT, SMEAR AVAILABLE FOR REVIEW     Comment: Performed at [facility] [address] [place], [place] [address]  CSF cell count with differential collection tube #: 4     Status: Abnormal   Collection Time: 05/24/20  9:53 PM  Result Value Ref Range   Tube # 4    Color, CSF COLORLESS COLORLESS   Appearance, CSF CLEAR CLEAR   Supernatant NOT INDICATED    RBC Count, CSF 3 (H) 0 /cu mm   WBC, CSF 0 0 - 5 /cu mm   Segmented Neutrophils-CSF RARE 0 - 6 %   Lymphs, CSF OCCASIONAL 40 - 80 %   Monocyte-Macrophage-Spinal Fluid RARE 15 - 45 %   Other Cells, CSF TOO FEW TO COUNT, SMEAR AVAILABLE FOR REVIEW     Comment: Performed at [facility] [address], [place], [place] [address]  CSF culture     Status: None (Preliminary result)   Collection Time: 05/24/20  9:53 PM   Specimen: CSF; Cerebrospinal Fluid  Result Value Ref Range   Specimen Description CSF    Special Requests NONE    Gram Stain NO WBC SEEN NO ORGANISMS SEEN CYTOSPIN SMEAR     Culture      NO GROWTH < 12 HOURS Performed at [facility] [address], [place], [place] [address]    Report Status PENDING   Glucose, CSF     Status: None   Collection Time: 05/24/20  9:53 PM  Result Value Ref Range   Glucose, CSF 53 40 - 70 mg/dL    Comment: Performed at [facility] [address] [place], [place] [address]  Protein, CSF     Status: None   Collection Time: 05/24/20  9:53 PM  Result Value Ref Range   Total  Protein, CSF 31 15 - 45 mg/dL    Comment: Performed at [facility] [address], [place], [place] [address]  Lactic acid, plasma     Status: None   Collection Time: 05/24/20 10:47 PM  Result Value Ref Range   Lactic Acid, Venous 0.9 0.5 - 1.9 mmol/L    Comment: Performed at [facility] [address], [place], [place] [address]  SARS Coronavirus 2 by RT PCR (hospital order, performed in  [facility] hospital lab) Nasopharyngeal Nasopharyngeal Swab     Status: None   Collection Time: 05/24/20 11:48 PM   Specimen: Nasopharyngeal Swab  Result Value Ref Range   SARS Coronavirus 2 NEGATIVE NEGATIVE    Comment: (NOTE) SARS-CoV-2 target nucleic acids are NOT DETECTED.  The SARS-CoV-2 RNA is generally detectable in upper and lower respiratory specimens during the acute phase of infection. The lowest concentration of SARS-CoV-2 viral copies this assay can detect is 250 copies / mL. A negative result does not preclude SARS-CoV-2  infection and should not be used as the sole basis for treatment or other patient management decisions.  A negative result may occur with improper specimen collection / handling, submission of specimen other than nasopharyngeal swab, presence of viral mutation(s) within the areas targeted by this assay, and inadequate number of viral copies (<250 copies / mL). A negative result must be combined with clinical observations, patient history, and epidemiological information.  Fact Sheet for Patients:   BoilerBrush.com.cy  Fact Sheet for Healthcare Providers: https://pope.com/  This test is not yet approved or  cleared by the [place] FDA and has been authorized for detection and/or diagnosis of SARS-CoV-2 by FDA under an Emergency Use Authorization (EUA).  This EUA will remain in effect (meaning this test can be used) for the duration of the COVID-19 declaration under Section 564(b)(1) of the Act, 21 U.S.C. section 360bbb-3(b)(1), unless the authorization is terminated or revoked sooner.  Performed at [facility], [address] [place], [place] [address]   Lactic acid, plasma     Status: None   Collection Time: 05/25/20  2:30 AM  Result Value Ref Range   Lactic Acid, Venous 0.8 0.5 - 1.9 mmol/L    Comment: Performed at [facility], [address] [place], [place] [address]  CBC      Status: Abnormal   Collection Time: 05/25/20  2:30 AM  Result Value Ref Range   WBC 1.2 (LL) 4.0 - 10.5 K/uL    Comment: REPEATED TO VERIFY WHITE COUNT CONFIRMED ON SMEAR THIS CRITICAL RESULT HAS VERIFIED AND BEEN CALLED TO A.PETTIFORD,RN BY MELISSA BROGDON ON 06 27 2021 AT 0334, AND HAS BEEN READ BACK.     RBC 3.76 (L) 3.87 - 5.11 MIL/uL   Hemoglobin 11.5 (L) 12.0 - 15.0 g/dL   HCT 59.1 (L) 36 - 46 %   MCV 87.8 80.0 - 100.0 fL   MCH 30.6 26.0 - 34.0 pg   MCHC 34.8 30.0 - 36.0 g/dL   RDW 63.8 46.6 - 59.9 %   Platelets 60 (L) 150 - 400 K/uL    Comment: CONSISTENT WITH PREVIOUS RESULT Immature Platelet Fraction may be clinically indicated, consider ordering this additional test [id number]    nRBC 0.0 0.0 - 0.2 %    Comment: Performed at [facility], [address] [place], [place] [address]  Comprehensive metabolic panel     Status: Abnormal   Collection Time: 05/25/20  2:30 AM  Result Value Ref Range   Sodium 134 (L) 135 - 145 mmol/L   Potassium 3.3 (L) 3.5 - 5.1 mmol/L   Chloride 104 98 - 111 mmol/L   CO2 20 (L) 22 - 32 mmol/L   Glucose, Bld 87 70 - 99 mg/dL    Comment: Glucose reference range applies only to samples taken after fasting for at least 8 hours.   BUN 8 6 - 20 mg/dL   Creatinine, Ser 0.92 0.44 - 1.00 mg/dL   Calcium 7.6 (L) 8.9 - 10.3 mg/dL   Total Protein 4.7 (L) 6.5 - 8.1 g/dL   Albumin 2.7 (L) 3.5 - 5.0 g/dL   AST 330 (H) 15 - 41 U/L   ALT 88 (H) 0 - 44 U/L   Alkaline Phosphatase 62 38 - 126 U/L   Total Bilirubin 2.1 (H) 0.3 - 1.2 mg/dL   GFR calc non Af Amer >60 >60 mL/min   GFR calc Af Amer >60 >60 mL/min   Anion gap 10 5 -  15    Comment: Performed at [facility], [address], [place], [place] [address]    MICRO: reviewed IMAGING: DG CHEST PORT 1 VIEW  Result Date: 05/24/2020 CLINICAL DATA:  Fever EXAM: PORTABLE CHEST 1 VIEW COMPARISON:  None. FINDINGS: Heart and mediastinal contours are within normal limits. No focal opacities  or effusions. No acute bony abnormality. IMPRESSION: No active disease. Electronically Signed   By: Charlett Nose M.D.   On: 05/24/2020 20:55    HISTORICAL MICRO/IMAGING  Assessment/Plan:  54yo F with what appears to be consistent with tickborne illness with fever/ ha/myalgia and leukopenia/thrombocytopenia/transaminitis. Agree with sending RMSF and erhlichiosis titers - continue with IV doxycycline  IV Q12 - anticipate that we will start seeing improvement over the next 48hrs - watch closely for worsening, but at this time appears stable  transaminitis = likely due to initial presentation - can check hep b s ag and hep c ab  Headache = appears to have 2 features both throbbing behind eyes and localized Left occipital discomfort. Defer to primary team for management

## 2020-05-25 NOTE — ED Notes (Signed)
Report attempted 

## 2020-05-25 NOTE — Progress Notes (Signed)
Update: 1) LP results back: CSF looks nothing like bacterial meningitis thankfully 2) DIC pnl: no schistocytes and nl fibrinogen argue against DIC   On another note: J&J vaccine in March - the thrombosis / thrombocytopenia reaction seen with this vaccine is typically seen between 3 to 15 days after vaccine given.  We are now over 3 months, therefore this seems less likely.

## 2020-05-25 NOTE — Progress Notes (Signed)
PROGRESS NOTE  Kelly Jensen WCB:[id number] DOB: 1966-04-06 DOA: 05/24/2020 PCP: Willow Ora, MD  Brief History   The patient is a 54 yr old woman who presented to [facility] ED with complaints of occipital and retro-orbital headache x 4 days accompanied by malaise, nausea and vomiting. She also complains of a sharp shooting pain in her left posterior neck. The patient often walks in a heavily tick infested area. She denies the presence of any new rashes. She states that she has been having rashes.   In the ED the patient was found to have low platelets, and low WBC count. LFT's were elevated. She was started on doxycycline. PCR, Immunofixation, and titers have been ordered for RMSF, erlichia, and anaplasma. LP was performed which was not consistent with bacterial meningitis. RUQ ultrasound has been ordered to evaluate biliary tree. CT of the neck was performed due to patient's complaints of sharp shooting pains in her neck. It demonstrated mild degenerative changes without evidence of high-drade stenosis. There is mild left greater than right facet arthropathy.  This morning the patient stated that her nausea was improved, but she continues to have the headaches.  Infectious disease has been consulted. The patient has been admitted to a telemetry bed. She is receiving IV fluids and V Doxycycline.  Consultants  . Infectious disease.  Procedures  . Lumbar puncture  Antibiotics   Anti-infectives (From admission, onward)   Start     Dose/Rate Route Frequency Ordered Stop   05/25/20 0900  doxycycline (VIBRAMYCIN) 100 mg in sodium chloride 0.9 % 250 mL IVPB     Discontinue     100 mg 125 mL/hr over 120 Minutes Intravenous Every 12 hours 05/24/20 2043     05/24/20 2100  doxycycline (VIBRAMYCIN) 100 mg in sodium chloride 0.9 % 250 mL IVPB  Status:  Discontinued        100 mg 125 mL/hr over 120 Minutes Intravenous Every 12 hours 05/24/20 2038 05/24/20 2043   05/24/20 1830  doxycycline  (VIBRAMYCIN) 100 mg in sodium chloride 0.9 % 250 mL IVPB        100 mg 125 mL/hr over 120 Minutes Intravenous  Once 05/24/20 1754 05/24/20 2158    .  Subjective  The patient is resting in bed. She appears to be in mild distress from headache pain. No new complaints. Nausea is improved.  Objective   Vitals:  Vitals:   05/25/20 0122 05/25/20 0914  BP: 95/62 90/63  Pulse:  87  Resp:  16  Temp:    SpO2:  99%   Exam:  Constitutional:  . The patient is awake, alert, and oriented x 3. Mild distress from head ache pain. Respiratory:  . No increased work of breathing. . No wheezes, rales, or rhonchi . No tactile fremitus Cardiovascular:  . Regular rate and rhythm . No murmurs, ectopy, or gallups. . No lateral PMI. No thrills. Abdomen:  . Abdomen is soft, non-tender, non-distended . No hernias, masses, or organomegaly . Normoactive bowel sounds.  Musculoskeletal:  . No cyanosis, clubbing, or edema Skin:  . No rashes, lesions, ulcers . palpation of skin: no induration or nodules Neurologic:  . CN 2-12 intact . Sensation all 4 extremities intact Psychiatric:  . Mental status o Mood, affect appropriate o Orientation to person, place, time  . judgment and insight appear intact  I have personally reviewed the following:   Today's Data  . CSF, CBC, CMP, Vitals.  Micro Data  . RMSF titers . Anaplasma  and erlichia PCR and immunofixation  Imaging  . CT C-spine  Scheduled Meds: . lidocaine (PF)  5 mL Other Once  . sodium chloride flush  3 mL Intravenous Once   Continuous Infusions: . sodium chloride Stopped (05/25/20 0812)  . doxycycline (VIBRAMYCIN) IV 100 mg (05/25/20 0812)  . methocarbamol (ROBAXIN) IV 500 mg (05/25/20 0226)    Principal Problem:   Ehrlichiosis Active Problems:   Leukopenia   Thrombocytopenia (HCC)   Headache   Transaminitis   LOS: 1 day   A & P   Febrile illness with headache, myalgia, fevers, leukopenia, and thrombocytopenia: Given  reported suspicion of tick borne illness, presentation, lab findings.  Patient has an almost picture perfect presentation for Human Ehrlichiosis.  RMSF also possible but no Rash and leukopenia make this slightly less likely. PCR, immunofixation, and titers have been drawn for erlichiosis, anaplasma, and RMSF. These may take some time to come back, and it may be too early to get a positive result. Continue IV Doxycycline. LP results are not consistent with bacterial meningitis. CT neck demonstrated only degenerative changes without clinically significant stenosis. I appreciate the assistance of infectious disease. At this point, will continue doxycycline for coverage of ehrlichiosis, RMSF and other tick borne illnesses.   Headache: Pain control. LP results not consistent with bacterial meningitis.  C-spine pain: Appears musculoskeletal. CT Spine demonstrated primarily degenerative changes with mild left greater than right facet arthropathy.   Leukocytosis and thrombocytosis: Monitor. Further decreases this morning may be dilutional. Will dc IV fluids and continue to monitor.  Elevated LFT's: Possibly part of a rickettsial syndrome. Right upper quadrant ultrasound pending.   Nausea and vomiting: Largely resolved, although the patient has no appetite still.  I have seen and examined this patient myself. I have spent 48 minutes in her evaluation and care.  DVT prophylaxis: SCDs Code Status: Full Family Communication: I have discussed the patient with her husband, Kelly Jensen.  Disposition Plan: Home after diagnosis and treatment of illness  Status is: Inpatient  Remains inpatient appropriate because:Inpatient level of care appropriate due to severity of illness   Dispo: The patient is from: Home              Anticipated d/c is to: Home              Anticipated d/c date is: 1 day              Patient currently is not medically stable to d/c.  Aldair Rickel, DO [facility] Direct contact:  see [email or web address]  7PM-7AM contact night coverage as above 05/25/2020, 4:27 PM  LOS: 1 day

## 2020-05-25 NOTE — Progress Notes (Signed)
CRITICAL VALUE ALERT  Critical Value:  WBC count 1.2  Date & Time Notied:  05/25/2020 3:36 AM   Provider Notified: Andrez Grime  Orders Received/Actions taken: advised to monitor for fever/chills

## 2020-05-26 ENCOUNTER — Telehealth: Payer: Self-pay | Admitting: Family Medicine

## 2020-05-26 ENCOUNTER — Other Ambulatory Visit: Payer: Self-pay | Admitting: Family

## 2020-05-26 DIAGNOSIS — R509 Fever, unspecified: Secondary | ICD-10-CM

## 2020-05-26 DIAGNOSIS — D72819 Decreased white blood cell count, unspecified: Secondary | ICD-10-CM | POA: Diagnosis not present

## 2020-05-26 DIAGNOSIS — R519 Headache, unspecified: Secondary | ICD-10-CM | POA: Diagnosis not present

## 2020-05-26 DIAGNOSIS — A7740 Ehrlichiosis, unspecified: Secondary | ICD-10-CM

## 2020-05-26 LAB — HSV DNA BY PCR (REFERENCE LAB)
HSV 1 DNA: NEGATIVE
HSV 2 DNA: NEGATIVE

## 2020-05-26 LAB — CBC WITH DIFFERENTIAL/PLATELET
Abs Immature Granulocytes: 0.01 10*3/uL (ref 0.00–0.07)
Basophils Absolute: 0 10*3/uL (ref 0.0–0.1)
Basophils Relative: 2 %
Eosinophils Absolute: 0 10*3/uL (ref 0.0–0.5)
Eosinophils Relative: 0 %
HCT: 37.2 % (ref 36.0–46.0)
Hemoglobin: 12.8 g/dL (ref 12.0–15.0)
Immature Granulocytes: 1 %
Lymphocytes Relative: 58 %
Lymphs Abs: 1.2 10*3/uL (ref 0.7–4.0)
MCH: 30.5 pg (ref 26.0–34.0)
MCHC: 34.4 g/dL (ref 30.0–36.0)
MCV: 88.8 fL (ref 80.0–100.0)
Monocytes Absolute: 0.2 10*3/uL (ref 0.1–1.0)
Monocytes Relative: 8 %
Neutro Abs: 0.6 10*3/uL — ABNORMAL LOW (ref 1.7–7.7)
Neutrophils Relative %: 31 %
Platelets: 71 10*3/uL — ABNORMAL LOW (ref 150–400)
RBC: 4.19 MIL/uL (ref 3.87–5.11)
RDW: 12.8 % (ref 11.5–15.5)
WBC: 2 10*3/uL — ABNORMAL LOW (ref 4.0–10.5)
nRBC: 0 % (ref 0.0–0.2)

## 2020-05-26 LAB — COMPREHENSIVE METABOLIC PANEL
ALT: 126 U/L — ABNORMAL HIGH (ref 0–44)
AST: 170 U/L — ABNORMAL HIGH (ref 15–41)
Albumin: 2.7 g/dL — ABNORMAL LOW (ref 3.5–5.0)
Alkaline Phosphatase: 123 U/L (ref 38–126)
Anion gap: 7 (ref 5–15)
BUN: 7 mg/dL (ref 6–20)
CO2: 26 mmol/L (ref 22–32)
Calcium: 8.3 mg/dL — ABNORMAL LOW (ref 8.9–10.3)
Chloride: 109 mmol/L (ref 98–111)
Creatinine, Ser: 0.81 mg/dL (ref 0.44–1.00)
GFR calc Af Amer: >60 mL/min (ref >60)
GFR calc non Af Amer: >60 mL/min (ref >60)
Glucose, Bld: 93 mg/dL (ref 70–99)
Potassium: 3.9 mmol/L (ref 3.5–5.1)
Sodium: 142 mmol/L (ref 135–145)
Total Bilirubin: 1.4 mg/dL — ABNORMAL HIGH (ref 0.3–1.2)
Total Protein: 4.8 g/dL — ABNORMAL LOW (ref 6.5–8.1)

## 2020-05-26 LAB — SEDIMENTATION RATE: Sed Rate: 1 mm/hr (ref 0–22)

## 2020-05-26 LAB — C-REACTIVE PROTEIN: CRP: 3.7 mg/dL — ABNORMAL HIGH (ref <1.0)

## 2020-05-26 NOTE — Progress Notes (Signed)
[facility]  Date of Admission:  05/24/2020     Total days of antibiotics 2 Doxycycline 2         ASSESSMENT:  Kelly Jensen continues to have headaches with varying intensity but otherwise feeling improved today. Ehrlichiosis and RMSF testing remains pending. She has remained afebrile and nausea and vomiting has resolved with slowly improving blood work. Continue current dose of doxycycline. Anticipate continued improvements over the next 24 hours with total treatment duration around 5-7 days.   PLAN:  1. Continue doxycycline 2. Await Ehrlichiosis and RMSF lab work.   Principal Problem:   Ehrlichiosis Active Problems:   Leukopenia   Thrombocytopenia (HCC)   Headache   Transaminitis   . lidocaine (PF)  5 mL Other Once  . sodium chloride flush  3 mL Intravenous Once    SUBJECTIVE:  Afebrile overnight with no acute events. RMSF and ehrlichoisis lab work remains pending. Continues to have headache of varying intensities which Tylenol makes manageable. No fevers, chills, sweats, rashes, nausea, or vomiting. Overall feeling better since admission.   No Known Allergies   Review of Systems: Review of Systems  Constitutional: Negative for chills, fever and weight loss.  Respiratory: Negative for cough, shortness of breath and wheezing.   Cardiovascular: Negative for chest pain and leg swelling.  Gastrointestinal: Negative for abdominal pain, constipation, diarrhea, nausea and vomiting.  Skin: Negative for rash.  Neurological: Positive for headaches.      OBJECTIVE: Vitals:   05/25/20 0122 05/25/20 0914 05/25/20 2044 05/26/20 0756  BP: 95/62 90/63 102/76 106/77  Pulse:  87 81 86  Resp:  16 (!) 21 16  Temp:   98.7 F (37.1 C) 99.7 F (37.6 C)  TempSrc:   Oral Oral  SpO2:  99% 95% 95%  Weight:      Height:       Body mass index is 25.69 kg/m.  Physical Exam Constitutional:      General: She is not in acute distress.     Appearance: She is well-developed.     Comments: Lying in bed with head of bed elevated; pleasant.   Cardiovascular:     Rate and Rhythm: Normal rate and regular rhythm.     Heart sounds: Normal heart sounds.  Pulmonary:     Effort: Pulmonary effort is normal.     Breath sounds: Normal breath sounds.  Skin:    General: Skin is warm and dry.  Neurological:     Mental Status: She is alert and oriented to person, place, and time.  Psychiatric:        Behavior: Behavior normal.        Thought Content: Thought content normal.        Judgment: Judgment normal.     Lab Results Lab Results  Component Value Date   WBC 2.0 (L) 05/26/2020   HGB 12.8 05/26/2020   HCT 37.2 05/26/2020   MCV 88.8 05/26/2020   PLT 71 (L) 05/26/2020    Lab Results  Component Value Date   CREATININE 0.81 05/26/2020   BUN 7 05/26/2020   NA 142 05/26/2020   K 3.9 05/26/2020   CL 109 05/26/2020   CO2 26 05/26/2020    Lab Results  Component Value Date   ALT 126 (H) 05/26/2020   AST 170 (H) 05/26/2020   ALKPHOS 123 05/26/2020   BILITOT 1.4 (H) 05/26/2020     Microbiology: Recent Results (from the past 240 hour(s))  Blood culture (  routine x 2)     Status: None (Preliminary result)   Collection Time: 05/24/20  5:45 PM   Specimen: BLOOD  Result Value Ref Range Status   Specimen Description BLOOD SITE NOT SPECIFIED  Final   Special Requests   Final    BOTTLES DRAWN AEROBIC AND ANAEROBIC Blood Culture results may not be optimal due to an inadequate volume of blood received in culture bottles   Culture   Final    NO GROWTH < 24 HOURS Performed at [facility], [address], [place], [place] [address]    Report Status PENDING  Incomplete  Blood culture (routine x 2)     Status: None (Preliminary result)   Collection Time: 05/24/20  5:50 PM   Specimen: BLOOD  Result Value Ref Range Status   Specimen Description BLOOD SITE NOT SPECIFIED  Final   Special Requests   Final    BOTTLES DRAWN  AEROBIC AND ANAEROBIC Blood Culture results may not be optimal due to an excessive volume of blood received in culture bottles   Culture   Final    NO GROWTH < 24 HOURS Performed at [facility], [address] [place], [place] [address]    Report Status PENDING  Incomplete  CSF culture     Status: None (Preliminary result)   Collection Time: 05/24/20  9:53 PM   Specimen: CSF; Cerebrospinal Fluid  Result Value Ref Range Status   Specimen Description CSF  Final   Special Requests NONE  Final   Gram Stain NO WBC SEEN NO ORGANISMS SEEN CYTOSPIN SMEAR   Final   Culture   Final    NO GROWTH < 12 HOURS Performed at [facility], [address], [place], [place] [address]    Report Status PENDING  Incomplete  SARS Coronavirus 2 by RT PCR (hospital order, performed in [facility] hospital lab) Nasopharyngeal Nasopharyngeal Swab     Status: None   Collection Time: 05/24/20 11:48 PM   Specimen: Nasopharyngeal Swab  Result Value Ref Range Status   SARS Coronavirus 2 NEGATIVE NEGATIVE Final    Comment: (NOTE) SARS-CoV-2 target nucleic acids are NOT DETECTED.  The SARS-CoV-2 RNA is generally detectable in upper and lower respiratory specimens during the acute phase of infection. The lowest concentration of SARS-CoV-2 viral copies this assay can detect is 250 copies / mL. A negative result does not preclude SARS-CoV-2 infection and should not be used as the sole basis for treatment or other patient management decisions.  A negative result may occur with improper specimen collection / handling, submission of specimen other than nasopharyngeal swab, presence of viral mutation(s) within the areas targeted by this assay, and inadequate number of viral copies (<250 copies / mL). A negative result must be combined with clinical observations, patient history, and epidemiological information.  Fact Sheet for Patients:   BoilerBrush.com.cy  Fact Sheet for  Healthcare Providers: https://pope.com/  This test is not yet approved or  cleared by the [place] FDA and has been authorized for detection and/or diagnosis of SARS-CoV-2 by FDA under an Emergency Use Authorization (EUA).  This EUA will remain in effect (meaning this test can be used) for the duration of the COVID-19 declaration under Section 564(b)(1) of the Act, 21 U.S.C. section 360bbb-3(b)(1), unless the authorization is terminated or revoked sooner.  Performed at [facility], [address], [place], [place] [address]      Marcos Eke, NP [facility]  05/26/2020  9:17 AM

## 2020-05-26 NOTE — Plan of Care (Signed)

## 2020-05-26 NOTE — Discharge Summary (Signed)
Physician Discharge Summary  ALVENIA TREESE JGG:[id number] DOB: [birth year] DOA: 05/24/2020  PCP: Leamon Arnt, MD  Admit date: 05/24/2020 Discharge date: 05/26/2020 Consultations: Infectious diseases Admitted From: home Disposition: home  Discharge Diagnoses:  Principal Problem:   Ehrlichiosis Active Problems:   Leukopenia   Thrombocytopenia ([place])   Headache   Transaminitis   Hospital Course Summary: 54 y.o.femalewith medical history significant ofeczema, presents to ED with 4 day h/o severe occipital and retro-orbital headache, generalized weakness, N/V, malaise.She also complains of a sharp shooting pain in her left posterior neck.Pt goes outdoors in the woods a lot, large number of ticks in their area. Pt's husband, who is a physician, is suspicious of a tick borne illness, though no specific tick bite identified.Pt husband gave her 153m doxycycline before coming here. ED Course: Afebrile,Tmax 99.3, HR 113, BP 90/67.WBC 1.9k, Platelets 70, AST 121, ALT 103.UA unremarkable.COVID test (both rapid and PCR) as outpt have come back negative. Pt did have J&J COVID vaccine in March. CXR neg.LP performed by EDP -not consistent with bacterial meningitis. RUQ ultrasound has been ordered to evaluate biliary tree. CT of the neck was performed due to patient's complaints of sharp shooting pains in her neck. It demonstrated mild degenerative changes without evidence of high-drade stenosis.PT given 1L NS bolus. [facility]: [facility] consulted   1. Febrile Illness with leucopenia/thrombocytopenia and elevated LFTs: presentation consistent with tick borne illness , RMSF and ehrlichiosis titres sent 6/26. Seen by ID. Cell counts finally up trending today, WBC nadir 1.2 yesterday->2.0 today, lymphocyte predominent. Absolute neutrophil count ~600. Hgb 11.5->12.8 and platelets 60->71.LFTs however still abnormal and elevated.  Seen by ID and agreed with IV  doxycycline treatment while here.  Patient feels improved and is anxious to go home.  Discussed with ID who recommended 10-day course of doxycycline (patient has recently filled prescription that she may complete).  ESR, C-reactive protein labs drawn for baseline.  She should have repeat labs upon follow-up with ID in 1 week.  2. 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  Required 1 dose of acetaminophen this morning but currently without any headache.. LP results not consistent with bacterial meningitis.Arbovirus, HSV titres sent , pending results.Nausea and vomiting largely resolved, appetite appears to be improving slowly.  Noted to be enjoying ice cream this afternoon.  Advised to minimize acetaminophen use in concern for LFTs and minimize NSAID use in concern for thrombocytopenia.  She is hopeful of nonpharmacological methods in controlling headache.  Treatment of problem #1 should help as well.  Patient advised to seek medical attention if she has refractory headache or any associated visual symptoms.  3. Neck pain:  Likely musculoskeletal.  Now resolved.  CT Spine demonstrated primarily degenerative changes with mild left greater than right facet arthropathy.  Follow-up as outpatient.  4. Pancytopenia: Monitor. Further decrease in counts over the weekend may be dilutional. Improving after dc IV fluids, continue to monitor with repeat labs by end of the week. DIC panel shows elevated d- dimer, nl fibrinogen, no schistocytes.  5. Elevated LFT's: Likely part of a rickettsial syndrome.  Per husband patient was also taking acetaminophen 1000 mg as needed for headaches about twice a day prior to admission.  Right upper quadrant ultrasound shows cholelithiasis without secondary signs of acute cholecystitis.  AST 121->112->170, ALT 103->88->126, ALP 84->62->123, T bili 2.3->2.1->1.4 today.  Hopefully will improve as acute illness resolves.  Should have repeat labs done by end of the week to  ensure  stability.  Discharge Exam:   Vitals:   05/25/20 0122 05/25/20 0914 05/25/20 2044 05/26/20 0756  BP: 95/62 90/63 102/76 106/77  Pulse:  87 81 86  Resp:  16 (!) 21 16  Temp:   98.7 F (37.1 C) 99.7 F (37.6 C)  TempSrc:   Oral Oral  SpO2:  99% 95% 95%  Weight:      Height:        General: Pt is alert, awake, not in acute distress Cardiovascular: RRR, S1/S2 +, no rubs, no gallops Respiratory: CTA bilaterally, no wheezing, no rhonchi Abdominal: Soft, NT, ND, bowel sounds + Extremities: no edema, no cyanosis  Discharge Condition:Stable CODE STATUS: Full code Diet recommendation: Regular diet Recommendations for Outpatient Follow-up:  1. Follow up with PCP: 4 to 5 days 2. Follow up with consultants: Infectious diseases clinic in 1 week 3. Please obtain follow up labs including: CBC, CMP, ESR.  Home Health services upon discharge: None Equipment/Devices upon discharge: None   Discharge Instructions:  Discharge Instructions    Call MD for:  difficulty breathing, headache or visual disturbances   Complete by: As directed    Call MD for:  extreme fatigue   Complete by: As directed    Call MD for:  persistant dizziness or light-headedness   Complete by: As directed    Call MD for:  persistant nausea and vomiting   Complete by: As directed    Call MD for:  severe uncontrolled pain   Complete by: As directed    Call MD for:  temperature >100.4   Complete by: As directed    Diet - low sodium heart healthy   Complete by: As directed    Increase activity slowly   Complete by: As directed      Allergies as of 05/26/2020   No Known Allergies     Medication List    STOP taking these medications   acetaminophen 500 MG tablet Commonly known as: TYLENOL   nitrofurantoin (macrocrystal-monohydrate) 100 MG capsule Commonly known as: MACROBID     TAKE these medications   ALPRAZolam 0.5 MG tablet Commonly known as: Xanax Take 1 tablet (0.5 mg total) by mouth 2 (two)  times daily as needed for anxiety. What changed:   when to take this  reasons to take this   clobetasol ointment 0.05 % Commonly known as: TEMOVATE Apply 1 application topically daily as needed (eczema).   doxycycline 100 MG capsule Commonly known as: MONODOX Take 100 mg by mouth 2 (two) times daily.   naproxen sodium 220 MG tablet Commonly known as: ALEVE Take 440 mg by mouth 2 (two) times daily as needed (pain/headache).   PRESCRIPTION MEDICATION Apply 1 application topically daily. Testosterone/progesterone cream compounded at [facility] - prescribed by Dr Bobbye [place]   progesterone 200 MG capsule Commonly known as: PROMETRIUM TAKE 1 CAPSULE BY MOUTH EVERY DAY What changed:   how much to take  when to take this   promethazine 12.5 MG tablet Commonly known as: PHENERGAN Take 12.5 mg by mouth every 8 (eight) hours as needed for nausea or vomiting.   Trintellix 20 MG Tabs tablet Generic drug: vortioxetine HBr Take 1 tablet (20 mg total) by mouth daily. What changed: when to take this       No Known Allergies    The results of significant diagnostics from this hospitalization (including imaging, microbiology, ancillary and laboratory) are listed below for reference.    Labs: BNP (last 3 results)  No results for input(s): BNP in the last 8760 hours. Basic Metabolic Panel: Recent Labs  Lab 05/24/20 1544 05/25/20 0230 05/26/20 0403  NA 136 134* 142  K 3.9 3.3* 3.9  CL 101 104 109  CO2 24 20* 26  GLUCOSE 104* 87 93  BUN _0 CREATININE 0.88 0.81 0.81  CALCIUM 8.8* 7.6* 8.3*   Liver Function Tests: Recent Labs  Lab 05/24/20 1544 05/25/20 0230 05/26/20 0403  AST 121* 112* 170*  ALT 103* 88* 126*  ALKPHOS 84 62 123  BILITOT 2.3* 2.1* 1.4*  PROT 5.8* 4.7* 4.8*  ALBUMIN 3.5 2.7* 2.7*   Recent Labs  Lab 05/24/20 1544  LIPASE 24   No results for input(s): AMMONIA in the last 168 hours. CBC: Recent Labs  Lab 05/24/20 1544  05/24/20 2147 05/25/20 0230 05/26/20 0403  WBC 1.9*  --  1.2* 2.0*  NEUTROABS  --   --   --  0.6*  HGB 14.2  --  11.5* 12.8  HCT 41.7  --  33.0* 37.2  MCV 89.1  --  87.8 88.8  PLT 70* 62* 60* 71*   Cardiac Enzymes: No results for input(s): CKTOTAL, CKMB, CKMBINDEX, TROPONINI in the last 168 hours. BNP: Invalid input(s): POCBNP CBG: No results for input(s): GLUCAP in the last 168 hours. D-Dimer Recent Labs    05/24/20 2147  DDIMER 5.34*   Hgb A1c No results for input(s): HGBA1C in the last 72 hours. Lipid Profile No results for input(s): CHOL, HDL, LDLCALC, TRIG, CHOLHDL, LDLDIRECT in the last 72 hours. Thyroid function studies No results for input(s): TSH, T4TOTAL, T3FREE, THYROIDAB in the last 72 hours.  Invalid input(s): FREET3 Anemia work up No results for input(s): VITAMINB12, FOLATE, FERRITIN, TIBC, IRON, RETICCTPCT in the last 72 hours. Urinalysis    Component Value Date/Time   COLORURINE AMBER (A) 05/24/2020 1550   APPEARANCEUR HAZY (A) 05/24/2020 1550   LABSPEC 1.035 (H) 05/24/2020 1550   PHURINE 5.0 05/24/2020 1550   GLUCOSEU NEGATIVE 05/24/2020 1550   HGBUR NEGATIVE 05/24/2020 1550   BILIRUBINUR NEGATIVE 05/24/2020 1550   KETONESUR 80 (A) 05/24/2020 1550   PROTEINUR 100 (A) 05/24/2020 1550   NITRITE NEGATIVE 05/24/2020 1550   LEUKOCYTESUR NEGATIVE 05/24/2020 1550   Sepsis Labs Invalid input(s): PROCALCITONIN,  WBC,  LACTICIDVEN Microbiology Recent Results (from the past 240 hour(s))  Blood culture (routine x 2)     Status: None (Preliminary result)   Collection Time: 05/24/20  5:45 PM   Specimen: BLOOD  Result Value Ref Range Status   Specimen Description BLOOD SITE NOT SPECIFIED  Final   Special Requests   Final    BOTTLES DRAWN AEROBIC AND ANAEROBIC Blood Culture results may not be optimal due to an inadequate volume of blood received in culture bottles   Culture   Final    NO GROWTH < 24 HOURS Performed at [facility], [address] [place], [place] [address]    Report Status PENDING  Incomplete  Blood culture (routine x 2)     Status: None (Preliminary result)   Collection Time: 05/24/20  5:50 PM   Specimen: BLOOD  Result Value Ref Range Status   Specimen Description BLOOD SITE NOT SPECIFIED  Final   Special Requests   Final    BOTTLES DRAWN AEROBIC AND ANAEROBIC Blood Culture results may not be optimal due to an excessive volume of blood received in culture bottles   Culture   Final    NO  GROWTH < 24 HOURS Performed at [facility], [place] [address], [place], [place] [address]    Report Status PENDING  Incomplete  CSF culture     Status: None (Preliminary result)   Collection Time: 05/24/20  9:53 PM   Specimen: CSF; Cerebrospinal Fluid  Result Value Ref Range Status   Specimen Description CSF  Final   Special Requests NONE  Final   Gram Stain NO WBC SEEN NO ORGANISMS SEEN CYTOSPIN SMEAR   Final   Culture   Final    NO GROWTH 2 DAYS Performed at [facility], [place] [address], [place], [place] [address]    Report Status PENDING  Incomplete  SARS Coronavirus 2 by RT PCR (hospital order, performed in [facility] hospital lab) Nasopharyngeal Nasopharyngeal Swab     Status: None   Collection Time: 05/24/20 11:48 PM   Specimen: Nasopharyngeal Swab  Result Value Ref Range Status   SARS Coronavirus 2 NEGATIVE NEGATIVE Final    Comment: (NOTE) SARS-CoV-2 target nucleic acids are NOT DETECTED.  The SARS-CoV-2 RNA is generally detectable in upper and lower respiratory specimens during the acute phase of infection. The lowest concentration of SARS-CoV-2 viral copies this assay can detect is 250 copies / mL. A negative result does not preclude SARS-CoV-2 infection and should not be used as the sole basis for treatment or other patient management decisions.  A negative result may occur with improper specimen collection / handling, submission of specimen other than nasopharyngeal swab, presence of viral  mutation(s) within the areas targeted by this assay, and inadequate number of viral copies (<250 copies / mL). A negative result must be combined with clinical observations, patient history, and epidemiological information.  Fact Sheet for Patients:   StrictlyIdeas.no  Fact Sheet for Healthcare Providers: BankingDealers.co.za  This test is not yet approved or  cleared by the [place] FDA and has been authorized for detection and/or diagnosis of SARS-CoV-2 by FDA under an Emergency Use Authorization (EUA).  This EUA will remain in effect (meaning this test can be used) for the duration of the COVID-19 declaration under Section 564(b)(1) of the Act, 21 U.S.C. section 360bbb-3(b)(1), unless the authorization is terminated or revoked sooner.  Performed at [facility], [place] [address], [place], [place] [address]     Procedures/Studies: CT CERVICAL SPINE WO CONTRAST  Result Date: 05/25/2020 CLINICAL DATA:  Neck pain EXAM: CT CERVICAL SPINE WITHOUT CONTRAST TECHNIQUE: Multidetector CT imaging of the cervical spine was performed without intravenous contrast. Multiplanar CT image reconstructions were also generated. COMPARISON:  None. FINDINGS: Alignment: There is trace retrolisthesis at C5-C6 and C6-C7. Skull base and vertebrae: Vertebral body heights are maintained apart from degenerative endplate irregularity. There is no sclerotic or destructive osseous lesion. Soft tissues and spinal canal: No prevertebral fluid or swelling. No visible canal collection. Disc levels: C2-C3: Small central disc protrusion and mild facet hypertrophy. No significant canal or foraminal stenosis. C3-C4: Small central disc protrusion and mild left greater than right facet hypertrophy. No significant canal or foraminal stenosis. C4-C5: Small central disc protrusion. No significant canal or foraminal stenosis. C5-C6: Small disc osteophyte complex and uncovertebral  hypertrophy. No significant canal stenosis. Mild foraminal stenosis. C6-C7: Small disc bulge with endplate osteophytes and uncovertebral hypertrophy. No significant canal or right foraminal stenosis. Minor left foraminal stenosis. C7-T1: Minimal disc bulge and left facet hypertrophy. No significant canal or foraminal stenosis. Upper chest: No apical lung mass. Other: Small subcentimeter thyroid nodules for which  no further follow-up is required. IMPRESSION: Mild degenerative changes without evidence of high-grade stenosis. There is mild, left greater than right facet arthropathy. Electronically Signed   By: Macy Mis M.D.   On: 05/25/2020 15:51   DG CHEST PORT 1 VIEW  Result Date: 05/24/2020 CLINICAL DATA:  Fever EXAM: PORTABLE CHEST 1 VIEW COMPARISON:  None. FINDINGS: Heart and mediastinal contours are within normal limits. No focal opacities or effusions. No acute bony abnormality. IMPRESSION: No active disease. Electronically Signed   By: Rolm Baptise M.D.   On: 05/24/2020 20:55   US Abdomen Limited RUQ  Result Date: 05/25/2020 CLINICAL DATA:  Abnormal liver function test. EXAM: ULTRASOUND ABDOMEN LIMITED RIGHT UPPER QUADRANT COMPARISON:  None. FINDINGS: Gallbladder: There is a single large 1.3 cm gallstone. There is no gallbladder wall thickening. There is some gallbladder sludge without evidence for a positive sonographic Murphy sign. Common bile duct: Diameter: 3 mm Liver: No focal lesion identified. Within normal limits in parenchymal echogenicity. Portal vein is patent on color Doppler imaging with normal direction of blood flow towards the liver. Other: None. IMPRESSION: There is cholelithiasis without secondary signs of acute cholecystitis. Electronically Signed   By: Constance Holster M.D.   On: 05/25/2020 18:00     Time coordinating discharge: Over 30 minutes  SIGNED:   Guilford Shi, MD  [facility] 05/26/2020, 2:10 PM

## 2020-05-26 NOTE — Telephone Encounter (Signed)
Pt husband called requesting to speak with CMA about pt.  Please advise.

## 2020-05-27 ENCOUNTER — Telehealth: Payer: Self-pay

## 2020-05-27 ENCOUNTER — Other Ambulatory Visit: Payer: Self-pay

## 2020-05-27 ENCOUNTER — Other Ambulatory Visit: Payer: Self-pay | Admitting: Family Medicine

## 2020-05-27 LAB — MISC LABCORP TEST (SEND OUT): Labcorp test code: 138412

## 2020-05-27 LAB — ROCKY MTN SPOTTED FVR ABS PNL(IGG+IGM)
RMSF IgG: NEGATIVE
RMSF IgG: NEGATIVE
RMSF IgM: 1.07 index — ABNORMAL HIGH (ref 0.00–0.89)
RMSF IgM: 1.09 index — ABNORMAL HIGH (ref 0.00–0.89)

## 2020-05-27 MED ORDER — ONDANSETRON 8 MG PO TBDP
8.0000 mg | ORAL_TABLET | Freq: Three times a day (TID) | ORAL | 0 refills | Status: DC | PRN
Start: 2020-05-27 — End: 2020-05-27

## 2020-05-27 MED ORDER — ONDANSETRON 8 MG PO TBDP: 8.0000 mg | ORAL_TABLET | Freq: Three times a day (TID) | ORAL | 0 refills | Status: DC | PRN

## 2020-05-27 NOTE — Telephone Encounter (Signed)
Transition Care Management Follow-up Telephone Call  Date of discharge and from where: 05/26/2020 [facility]  How have you been since you were released from the hospital? Weak nausea  Any questions or concerns? No   Items Reviewed:  Did the pt receive and understand the discharge instructions provided? Yes   Medications obtained and verified? Yes   Any new allergies since your discharge? No   Dietary orders reviewed? Yes  Do you have support at home? Yes   Functional Questionnaire: (I = Independent and D = Dependent) ADLs: I  Bathing/Dressing- I  Meal Prep- I  Eating- I  Maintaining continence- I  Transferring/Ambulation- I  Managing Meds- I  Follow up appointments reviewed:   PCP Hospital f/u appt confirmed? Yes  Scheduled to see Dr. Mardelle Matte  on 06/20/2020 @ 11:30.  Are transportation arrangements needed? No   If their condition worsens, is the pt aware to call PCP or go to the Emergency Dept.? Yes  Was the patient provided with contact information for the PCP's office or ED? Yes  Was to pt encouraged to call back with questions or concerns? Yes

## 2020-05-27 NOTE — Telephone Encounter (Signed)
Discussed care and f/u with pt's husband. Requesting zofran for persistent n/v. To make f/u with me next week.

## 2020-05-27 NOTE — Telephone Encounter (Signed)
Please advise 

## 2020-05-27 NOTE — Telephone Encounter (Signed)
Pt's husband called,Dr. Chilton Si, asking that Dr. Mardelle Matte call him as soon as possible. He stated that if she calls and he is in a procedure to please leave a message for a direct line to Dr. Mardelle Matte for him to call back.   Please call Dr. Chilton Si at his office at [phone]

## 2020-05-28 LAB — MISC LABCORP TEST (SEND OUT)
Labcorp test code: 164680
Labcorp test code: 138412

## 2020-05-28 LAB — CSF CULTURE W GRAM STAIN
Culture: NO GROWTH
Gram Stain: NONE SEEN

## 2020-05-29 ENCOUNTER — Encounter: Payer: Self-pay | Admitting: Family Medicine

## 2020-05-29 ENCOUNTER — Other Ambulatory Visit: Payer: Self-pay

## 2020-05-29 ENCOUNTER — Other Ambulatory Visit: Payer: 59

## 2020-05-29 ENCOUNTER — Telehealth: Payer: Self-pay | Admitting: Family Medicine

## 2020-05-29 DIAGNOSIS — A7740 Ehrlichiosis, unspecified: Secondary | ICD-10-CM

## 2020-05-29 LAB — COMPLETE METABOLIC PANEL WITH GFR
AG Ratio: 1.7 (calc) (ref 1.0–2.5)
ALT: 258 U/L — ABNORMAL HIGH (ref 6–29)
AST: 185 U/L — ABNORMAL HIGH (ref 10–35)
Albumin: 3.6 g/dL (ref 3.6–5.1)
Alkaline phosphatase (APISO): 124 U/L (ref 37–153)
BUN: 11 mg/dL (ref 7–25)
CO2: 29 mmol/L (ref 20–32)
Calcium: 8.7 mg/dL (ref 8.6–10.4)
Chloride: 102 mmol/L (ref 98–110)
Creat: 0.71 mg/dL (ref 0.50–1.05)
GFR, Est African American: 113 mL/min/{1.73_m2} (ref >=60)
GFR, Est Non African American: 98 mL/min/{1.73_m2} (ref >=60)
Globulin: 2.1 g/dL (calc) (ref 1.9–3.7)
Glucose, Bld: 103 mg/dL — ABNORMAL HIGH (ref 65–99)
Potassium: 3.7 mmol/L (ref 3.5–5.3)
Sodium: 139 mmol/L (ref 135–146)
Total Bilirubin: 1 mg/dL (ref 0.2–1.2)
Total Protein: 5.7 g/dL — ABNORMAL LOW (ref 6.1–8.1)

## 2020-05-29 LAB — EHRLICHIA ANTIBODY PANEL
E chaffeensis (HGE) Ab, IgG: NEGATIVE
E chaffeensis (HGE) Ab, IgM: NEGATIVE
E. Chaffeensis (HME) IgM Titer: NEGATIVE
E.Chaffeensis (HME) IgG: NEGATIVE

## 2020-05-29 LAB — CBC WITH DIFFERENTIAL/PLATELET
Absolute Monocytes: 839 cells/uL (ref 200–950)
Basophils Absolute: 100 cells/uL (ref 0–200)
Basophils Relative: 1.3 %
Eosinophils Absolute: 23 cells/uL (ref 15–500)
Eosinophils Relative: 0.3 %
HCT: 39.2 % (ref 35.0–45.0)
Hemoglobin: 13.2 g/dL (ref 11.7–15.5)
Lymphs Abs: 3850 cells/uL (ref 850–3900)
MCH: 30.6 pg (ref 27.0–33.0)
MCHC: 33.7 g/dL (ref 32.0–36.0)
MCV: 91 fL (ref 80.0–100.0)
MPV: 11 fL (ref 7.5–12.5)
Monocytes Relative: 10.9 %
Neutro Abs: 2888 cells/uL (ref 1500–7800)
Neutrophils Relative %: 37.5 %
Platelets: 232 10*3/uL (ref 140–400)
RBC: 4.31 10*6/uL (ref 3.80–5.10)
RDW: 13.1 % (ref 11.0–15.0)
Total Lymphocyte: 50 %
WBC: 7.7 10*3/uL (ref 3.8–10.8)

## 2020-05-29 LAB — CULTURE, BLOOD (ROUTINE X 2)
Culture: NO GROWTH
Culture: NO GROWTH

## 2020-05-29 MED ORDER — TRAMADOL HCL 50 MG PO TABS: 50.0000 mg | ORAL_TABLET | Freq: Three times a day (TID) | ORAL | 0 refills | Status: AC | PRN

## 2020-05-29 NOTE — Telephone Encounter (Signed)
Received telephone call from patient's husband this am. He has a lab appointment and wants to be sure all necessary labs have been ordered.  It looks like ID has followed up on + erlichiosis lab.  He reports pt is having hard to control headaches and decreased appetite but zofran is helping with nausea and keeping her hydrated. No more fevers.   Will send in tramadol for headaches and suggest pt schedule f/u with ID as directed at discharge for ongoing recommendations for possible erlichiosis, tickborne infection.   Has appt schedule with me: 06/10/2020

## 2020-05-30 LAB — ARBOVIRUS IGG, CSF
California Enceph IgG: <1:1 {titer}
Eastern eq encephalitis, IgG: <1:1 {titer}
St Louis encephalitis, IgG: <1:1 {titer}
West Nile IgG CSF: 0.21 IV (ref <1.30)
Western eq encephalitis, IgG: <1:1 {titer}

## 2020-06-01 LAB — MISC LABCORP TEST (SEND OUT): Labcorp test code: 138172

## 2020-06-06 ENCOUNTER — Ambulatory Visit: Payer: 59 | Admitting: Family Medicine

## 2020-06-10 ENCOUNTER — Encounter: Payer: Self-pay | Admitting: Family Medicine

## 2020-06-10 ENCOUNTER — Other Ambulatory Visit: Payer: Self-pay

## 2020-06-10 ENCOUNTER — Ambulatory Visit: Payer: 59 | Admitting: Family Medicine

## 2020-06-10 VITALS — BP 118/72 | HR 81 | Temp 98.0°F | Resp 18 | Ht 64.0 in | Wt 136.8 lb

## 2020-06-10 DIAGNOSIS — R7401 Elevation of levels of liver transaminase levels: Secondary | ICD-10-CM | POA: Diagnosis not present

## 2020-06-10 DIAGNOSIS — A7741 Ehrlichiosis chafeensis [E. chafeensis]: Secondary | ICD-10-CM | POA: Diagnosis not present

## 2020-06-10 LAB — CBC WITH DIFFERENTIAL/PLATELET
Basophils Absolute: 0.1 10*3/uL (ref 0.0–0.1)
Basophils Relative: 1.1 % (ref 0.0–3.0)
Eosinophils Absolute: 0 10*3/uL (ref 0.0–0.7)
Eosinophils Relative: 0.5 % (ref 0.0–5.0)
HCT: 41.1 % (ref 36.0–46.0)
Hemoglobin: 14.1 g/dL (ref 12.0–15.0)
Lymphocytes Relative: 51 % — ABNORMAL HIGH (ref 12.0–46.0)
Lymphs Abs: 2.8 10*3/uL (ref 0.7–4.0)
MCHC: 34.4 g/dL (ref 30.0–36.0)
MCV: 88.8 fl (ref 78.0–100.0)
Monocytes Absolute: 0.6 10*3/uL (ref 0.1–1.0)
Monocytes Relative: 11.4 % (ref 3.0–12.0)
Neutro Abs: 2 10*3/uL (ref 1.4–7.7)
Neutrophils Relative %: 36 % — ABNORMAL LOW (ref 43.0–77.0)
Platelets: 242 10*3/uL (ref 150.0–400.0)
RBC: 4.63 Mil/uL (ref 3.87–5.11)
RDW: 13.1 % (ref 11.5–15.5)
WBC: 5.5 10*3/uL (ref 4.0–10.5)

## 2020-06-10 LAB — COMPREHENSIVE METABOLIC PANEL
ALT: 23 U/L (ref 0–35)
AST: 20 U/L (ref 0–37)
Albumin: 4.1 g/dL (ref 3.5–5.2)
Alkaline Phosphatase: 70 U/L (ref 39–117)
BUN: 13 mg/dL (ref 6–23)
CO2: 30 mEq/L (ref 19–32)
Calcium: 9.3 mg/dL (ref 8.4–10.5)
Chloride: 102 mEq/L (ref 96–112)
Creatinine, Ser: 0.77 mg/dL (ref 0.40–1.20)
GFR: 78.53 mL/min (ref >60.00)
Glucose, Bld: 90 mg/dL (ref 70–99)
Potassium: 4.3 mEq/L (ref 3.5–5.1)
Sodium: 138 mEq/L (ref 135–145)
Total Bilirubin: 1.3 mg/dL — ABNORMAL HIGH (ref 0.2–1.2)
Total Protein: 6.3 g/dL (ref 6.0–8.3)

## 2020-06-10 MED ORDER — ONDANSETRON 8 MG PO TBDP: 8.0000 mg | ORAL_TABLET | Freq: Three times a day (TID) | ORAL | 0 refills | Status: DC | PRN

## 2020-06-10 NOTE — Progress Notes (Signed)
Subjective  CC:  Chief Complaint  Patient presents with  . Hospitalization Follow-up    Still is having nausea and trouble eating. She has to take Zofran in order to eat something.  Marland Kitchen Health Maintenance    Colonoscopy will be rescheduled due to her being sick.    HPI: Kelly Jensen is a 54 y.o. female who presents to the office today to address the problems listed above in the chief complaint.  54 year old female here for hospital follow-up.  Hospitalized 6/26 through 6/28 due to febrile illness associated with headaches and nausea and vomiting.  She had elevated liver function tests as well.  I reviewed hospital notes, and lab results.  Her illness started with back pain, myalgias and fever.  She then developed severe headaches and nausea and vomiting.  Fortunately, she is improving albeit slowly.  She has persistent nausea and decreased appetite but headaches have finally resolved.  Lab testing showed progressive transaminitis and there was one positive PCR DNA test for erlichiosis.  She was supposed to have follow-up with ID but this appointment was never scheduled.  She has not heard from their office.  Wt Readings from Last 3 Encounters:  06/10/20 136 lb 12.8 oz (62.1 kg)  05/25/20 149 lb 11.1 oz (67.9 kg)  05/08/20 143 lb (64.9 kg)    Assessment  1. Ehrlichiosis chafeensis (E. chafeensis)   2. Transaminitis      Plan   ehrlishiosis: We have scheduled a follow-up appointment with infectious disease for further evaluation and recommendations.  She did complete a 7-day course of doxycycline.  Her symptoms are slowly resolving.  Recommend good hydration, continue Zofran as needed for oral intake and to prevent further weight loss.  Rest and hydration recommended.  Recheck lab work today.  Will forward to infectious disease, an appointment with Dr. Ninetta Lights has been made for next week.  Follow up: As needed Visit date not found  Orders Placed This Encounter  Procedures  .  CBC with Differential/Platelet  . Comprehensive metabolic panel  . Ambulatory referral to Infectious Disease   Meds ordered this encounter  Medications  . ondansetron (ZOFRAN ODT) 8 MG disintegrating tablet    Sig: Take 1 tablet (8 mg total) by mouth every 8 (eight) hours as needed for nausea or vomiting.    Dispense:  30 tablet    Refill:  0      I reviewed the patients updated PMH, FH, and SocHx.    Patient Active Problem List   Diagnosis Date Noted  . Ehrlichiosis 05/24/2020  . Leukopenia 05/24/2020  . Thrombocytopenia (HCC) 05/24/2020  . Headache 05/24/2020  . Transaminitis 05/24/2020  . Dysthymia 03/13/2020  . 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 Premature menopause on HRT 04/11/2014   Current Meds  Medication Sig  . clobetasol ointment (TEMOVATE) 0.05 % Apply 1 application topically daily as needed (eczema).   . ondansetron (ZOFRAN ODT) 8 MG disintegrating tablet Take 1 tablet (8 mg total) by mouth every 8 (eight) hours as needed for nausea or vomiting.  Marland Kitchen PRESCRIPTION MEDICATION Apply 1 application topically daily. Testosterone/progesterone cream compounded at [facility] - prescribed by Dr Carrington Clamp  . progesterone (PROMETRIUM) 200 MG capsule TAKE 1 CAPSULE BY MOUTH EVERY DAY (Patient taking differently: Take 200 mg by mouth at bedtime. )  . TRINTELLIX 20 MG TABS tablet Take 1 tablet (20 mg total) by mouth daily. (Patient taking differently: Take 20 mg by mouth at bedtime. )  . [  DISCONTINUED] ondansetron (ZOFRAN ODT) 8 MG disintegrating tablet Take 1 tablet (8 mg total) by mouth every 8 (eight) hours as needed for nausea or vomiting.    Allergies: Patient has No Known Allergies. Family History: Patient family history includes Chromosomal disorder in her daughter; Healthy in her brother, daughter, father, mother, sister, sister, sister, and son; Heart disease in her paternal grandfather. Social History:  Patient  reports that she has never smoked. She has never  used smokeless tobacco. She reports current alcohol use of about 2.0 standard drinks of alcohol per week. She reports that she does not use drugs.  Review of Systems: Constitutional: Negative for fever malaise or anorexia Cardiovascular: negative for chest pain Respiratory: negative for SOB or persistent cough Gastrointestinal: negative for abdominal pain, no reflux  Objective  Vitals: BP 118/72   Pulse 81   Temp 98 F (36.7 C) (Temporal)   Resp 18   Ht 5\' 4"  (1.626 m)   Wt 136 lb 12.8 oz (62.1 kg)   SpO2 98%   BMI 23.48 kg/m  General: no acute distress , A&Ox3, no jaundice HEENT: PEERL, conjunctiva normal, neck is supple Cardiovascular:  RRR without murmur or gallop.  Respiratory:  Good breath sounds bilaterally, CTAB with normal respiratory effort  Gastrointestinal: soft, flat abdomen, normal active bowel sounds, no palpable masses, no hepatosplenomegaly, no appreciated hernias Skin:  Warm, no rashes     Commons side effects, risks, benefits, and alternatives for medications and treatment plan prescribed today were discussed, and the patient expressed understanding of the given instructions. Patient is instructed to call or message via MyChart if he/she has any questions or concerns regarding our treatment plan. No barriers to understanding were identified. We discussed Red Flag symptoms and signs in detail. Patient expressed understanding regarding what to do in case of urgent or emergency type symptoms.   Medication list was reconciled, printed and provided to the patient in AVS. Patient instructions and summary information was reviewed with the patient as documented in the AVS. This note was prepared with assistance of Dragon voice recognition software. Occasional wrong-word or sound-a-like substitutions may have occurred due to the inherent limitations of voice recognition software  This visit occurred during the SARS-CoV-2 public health emergency.  Safety protocols were in  place, including screening questions prior to the visit, additional usage of staff PPE, and extensive cleaning of exam room while observing appropriate

## 2020-06-10 NOTE — Patient Instructions (Addendum)
Please follow up if symptoms do not improve or as needed.   I have placed a referral to ID for follow up.  We should call you with an appointment or you can call: [facility] [phone] [address]

## 2020-06-12 ENCOUNTER — Other Ambulatory Visit: Payer: Self-pay

## 2020-06-12 ENCOUNTER — Encounter: Payer: Self-pay | Admitting: Infectious Diseases

## 2020-06-12 ENCOUNTER — Ambulatory Visit: Payer: 59 | Admitting: Infectious Diseases

## 2020-06-12 DIAGNOSIS — A7740 Ehrlichiosis, unspecified: Secondary | ICD-10-CM

## 2020-06-12 NOTE — Assessment & Plan Note (Signed)
She does have si/sx of a tick borne illness (leukopenia, thrombocytopenia, hepatitis) after outdoor exposure.  Her serologies are not clear for this.  She has completed doxy, feels better.  I spoke with her at length regarding tick borne illnesses.  I told her that I do not expect any long term sequlae from this.  I let her know that I am glad to answer further questions as needed.  I will defer to her PCP if she needs acute hepatitis panel? Available as needed, thank you for this interesting consult.

## 2020-06-12 NOTE — Progress Notes (Signed)
   Subjective:    Patient ID: Kelly Jensen, female    DOB: 12/05/1965, 54 y.o.   MRN: [id number]  HPI 54 yo F with hx of adm to hospital 6-27 with abnormal LFTs.  She was dx with ehrlichiosis, discussed with ID.  She was treated with 10 days of doxy which ended 3 days ago.  She has some residual n/v.  Her questions are- 1) are there long term sequalae-  Unlike Lyme, there not reported long term sequalae reported to be associated with this.  2) Can I get re-infected?  I think the answer to this is unclear- immunity is not clear to tick borne illnesses.  3) Do I have to be aware of meat?  There have been cases of patients developing meat "allergies" due to cross reactions of tick salivary antigens and meat protein. I told her that this was very unlikely and she does not need to avoid meat.    Review of Systems  Constitutional: Negative for chills and fever.  Gastrointestinal: Positive for nausea. Negative for constipation and diarrhea.  Skin: Positive for rash.  Please see HPI. All other systems reviewed and negative.      Objective:   Physical Exam Vitals reviewed.  Constitutional:      General: She is not in acute distress.    Appearance: Normal appearance. She is obese. She is not ill-appearing, toxic-appearing or diaphoretic.  Skin:    Findings: No rash.  Neurological:     General: No focal deficit present.     Mental Status: She is alert.           Assessment & Plan:

## 2020-10-25 ENCOUNTER — Telehealth: Payer: Self-pay | Admitting: Family Medicine

## 2020-10-25 MED ORDER — ZOLPIDEM TARTRATE 5 MG PO TABS: 5.0000 mg | ORAL_TABLET | Freq: Every evening | ORAL | 0 refills | Status: DC | PRN

## 2020-10-25 NOTE — Telephone Encounter (Signed)
Received a call from pts husband Coralie Carpen MD requesting that I call in some Remus Loffler for his wife- which she has used in the past - for short term insomnia treatment I agreed to send in a short term supply  Meds ordered this encounter  Medications  . zolpidem (AMBIEN) 5 MG tablet    Sig: Take 1 tablet (5 mg total) by mouth at bedtime as needed for sleep.    Dispense:  10 tablet    Refill:  0

## 2020-10-27 ENCOUNTER — Telehealth: Payer: Self-pay

## 2020-10-27 NOTE — Telephone Encounter (Signed)
Dr. Patsy Lager sent in script for Ambien. Husband called her directly. Notes are in patient's chart.

## 2020-10-27 NOTE — Telephone Encounter (Signed)
Nurse Assessment Nurse: Melanee Spry, RN, Beth Date/Time (Eastern Time): 10/25/2020 11:34:49 AM Confirm and document reason for call. If symptomatic, describe symptoms. ---Mr. Kelly Jensen requesting to speak to on call concerning his wife who is having difficulty sleeping. trintellix is prescribed. Wants Ambien. Does the patient have any new or worsening symptoms? ---Yes Will a triage be completed? ---Yes Related visit to physician within the last 2 weeks? ---Yes Does the PT have any chronic conditions? (i.e. diabetes, asthma, this includes High risk factors for pregnancy, etc.) ---No Is the patient pregnant or possibly pregnant? (Ask all females between the ages of 62-55) ---No Is this a behavioral health or substance abuse call? ---No Guidelines Guideline Title Affirmed Question Affirmed Notes Nurse Date/Time (Eastern Time) Insomnia Requesting medication for sleep ("sleeping pill") Newhart, RN, Beth 10/25/2020 11:37:09 AM Disp. Time Lamount Cohen Time) Disposition Final User 10/25/2020 11:43:34 AM Attempt made - message left Newhart, RN, [facility] 10/25/2020 11:59:12 AM Attempt made - message left [place], RN, Beth 10/25/2020 1:05:26 PM Called On-Call Provider [place], RN, Beth 10/25/2020 11:37:31 AM Call PCP within 24 Hours Yes Newhart, RN, Beth PLEASE NOTE: All timestamps contained within this report are represented as [place] Standard Time. CONFIDENTIALTY NOTICE: This fax transmission is intended only for the addressee. It contains information that is legally privileged, confidential or otherwise protected from use or disclosure. If you are not the intended recipient, you are strictly prohibited from reviewing, disclosing, copying using or disseminating any of this information or taking any action in reliance on or regarding this information. If you have received this fax in error, please notify us immediately by telephone so that we can arrange for its return to [place]. Phone: [phone], Toll-Free:  [phone], Fax: [phone] Page: 2 of 2 Call Id: [id number] Caller Disagree/Comply Comply Caller Understands Yes PreDisposition Did not know what to do Care Advice Given Per Guideline CALL PCP WITHIN 24 HOURS: * IF OFFICE WILL BE CLOSED: I'll page the on-call provider now. EXCEPTION: from 9 pm to 9 am. Since this isn't urgent, we'll hold the page until morning. CALL BACK IF: * Insomnia symptoms persist over 2 weeks * You become worse CARE ADVICE given per Insomnia (Adult) guideline. Referrals REFERRED TO PCP OFFICE Paging DoctorName Phone DateTime Result/Outcome Message Type Notes Warner Mccreedy [phone] 10/25/2020 1:05:26 PM Called On Call Provider - Reached Doctor Paged Warner Mccreedy 10/25/2020 1:05:53 PM Spoke with On Call - General Message Result Connected to husband - conference

## 2020-10-28 NOTE — Telephone Encounter (Signed)
Pt will need an appointment to discuss insomnia and treatment options. Please call to get her scheduled.

## 2020-10-28 NOTE — Telephone Encounter (Signed)
Please schedule patient.

## 2020-10-28 NOTE — Telephone Encounter (Signed)
LVM asking to call back.  

## 2020-11-23 ENCOUNTER — Other Ambulatory Visit: Payer: Self-pay | Admitting: Family Medicine

## 2020-11-23 DIAGNOSIS — F40243 Fear of flying: Secondary | ICD-10-CM

## 2020-11-24 NOTE — Telephone Encounter (Signed)
Last refill: 03/13/20 #30, 0 Last OV: 06/10/20 dx. hosp f/u

## 2021-06-09 ENCOUNTER — Other Ambulatory Visit: Payer: Self-pay | Admitting: Family Medicine

## 2021-06-09 DIAGNOSIS — F40243 Fear of flying: Secondary | ICD-10-CM

## 2021-06-09 NOTE — Telephone Encounter (Signed)
Patient is scheduled   

## 2021-06-09 NOTE — Telephone Encounter (Signed)
Please call patient. Last seen 06/10/2020. Needs to be seen for annual physical.  I have refilled xanax #30 but can't refill without ov. Please schedule for cpe, next available.

## 2021-07-01 ENCOUNTER — Encounter: Payer: Self-pay | Admitting: Family Medicine

## 2021-07-01 ENCOUNTER — Telehealth (INDEPENDENT_AMBULATORY_CARE_PROVIDER_SITE_OTHER): Payer: 59 | Admitting: Family Medicine

## 2021-07-01 VITALS — Temp 97.6°F

## 2021-07-01 DIAGNOSIS — U071 COVID-19: Secondary | ICD-10-CM

## 2021-07-01 MED ORDER — HYDROCODONE-ACETAMINOPHEN 5-325 MG PO TABS: 1.0000 | ORAL_TABLET | Freq: Four times a day (QID) | ORAL | 0 refills | Status: DC | PRN

## 2021-07-01 NOTE — Progress Notes (Signed)
Virtual Visit via Video Note  Subjective  CC:  Chief Complaint  Patient presents with   Covid Positive    Chills,body aches,congestion,cough,headaches,fever,sore throat x 4 days tested positive Monday     I connected with Nat Christen on 07/01/21 at 11:30 AM EDT by a video enabled telemedicine application and verified that I am speaking with the correct person using two identifiers. Location patient: Home Location provider: [facility] [address] Office Persons participating in the virtual visit: Kelly Jensen, Kelly Ora, Kelly Jensen Kelly Jensen CMA  I discussed the limitations of evaluation and management by telemedicine and the availability of in person appointments. The patient expressed understanding and agreed to proceed. HPI: Kelly Jensen is a 55 y.o. female who was contacted today to address the problems listed above in the chief complaint. 55 year old healthy postmenopausal female presents with typical COVID symptoms including fever to 100, sore throat, cough, congestion, severe headache that is intermittent and not associated with neurologic deficit, and body aches.  She denies shortness of breath or dyspnea on exertion.  She denies chest pain, nausea, vomiting, diarrhea.  Appetite is fair.  She has been vaccinated and had 1 booster shot.  She is not immunocompromised.  Her worst symptom is headache.  She describes it as sharp and stabbing.  She had a similar headache when she had ehrlichiosis last year.  Tramadol even at that time was not helpful.  Over-the-counter medicines did not help the headache.  She has no neurologic deficits.  No neck stiffness.  Assessment  1. COVID-19 virus infection      Plan  COVID infection: Typical symptoms, stable.  Day 4 of illness.  She is a low risk patient.  No high risk factors.  Discussed options of antiviral treatment.  We defer antiviral treatment at this time.  We will treat supportively with: Cough  medicines, Mucinex DM, Advil or Tylenol, rest, hydration and ordered hydrocodone to treat headaches.  She has long history of significant headaches with febrile illnesses.  Discussed red flag symptoms of headaches including fever, neck stiffness, worst headache of her life: All of those would require emergent evaluation.  Patient understands and agrees with care plan.  I discussed the assessment and treatment plan with the patient. The patient was provided an opportunity to ask questions and all were answered. The patient agreed with the plan and demonstrated an understanding of the instructions.   The patient was advised to call back or seek an in-person evaluation if the symptoms worsen or if the condition fails to improve as anticipated. Follow up: As scheduled for her complete physical 09/07/2021  Meds ordered this encounter  Medications   HYDROcodone-acetaminophen (NORCO) 5-325 MG tablet    Sig: Take 1 tablet by mouth every 6 (six) hours as needed for moderate pain.    Dispense:  20 tablet    Refill:  0      I reviewed the patients updated PMH, FH, and SocHx.    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   clobetasol ointment (TEMOVATE) 0.05 % Apply 1 application topically daily as needed (eczema).    escitalopram (LEXAPRO) 20 MG  tablet Take by mouth.   HYDROcodone-acetaminophen (NORCO) 5-325 MG tablet Take 1 tablet by mouth every 6 (six) hours as needed for moderate pain.   PRESCRIPTION MEDICATION Apply 1 application topically daily. Testosterone/progesterone cream compounded at [facility] - prescribed by Dr Carrington Clamp   progesterone (PROMETRIUM) 200 MG capsule TAKE 1 CAPSULE BY MOUTH EVERY DAY  (Patient taking differently: Take 200 mg by mouth at bedtime.)   zolpidem (AMBIEN) 5 MG tablet Take 1 tablet (5 mg total) by mouth at bedtime as needed for sleep.   [DISCONTINUED] ondansetron (ZOFRAN ODT) 8 MG disintegrating tablet Take 1 tablet (8 mg total) by mouth every 8 (eight) hours as needed for nausea or vomiting.    Allergies: Patient has No Known Allergies. Family History: Patient family history includes Chromosomal disorder in her daughter; Healthy in her brother, daughter, father, mother, sister, sister, sister, and son; Heart disease in her paternal grandfather. Social History:  Patient  reports that she has never smoked. She has never used smokeless tobacco. She reports current alcohol use of about 2.0 standard drinks of alcohol per week. She reports that she does not use drugs.  Review of Systems: Constitutional: Positive for fever malaise or anorexia Cardiovascular: negative for chest pain Respiratory: negative for SOB or persistent cough, dry intermittent cough present Gastrointestinal: negative for abdominal pain  OBJECTIVE Vitals: Temp 97.6 F (36.4 C)  General: no acute distress , A&Ox3, no respiratory distress Respiratory, no tachypnea, normal speech  Kelly Ora, Kelly Jensen

## 2021-09-07 ENCOUNTER — Other Ambulatory Visit: Payer: Self-pay

## 2021-09-07 ENCOUNTER — Encounter: Payer: Self-pay | Admitting: Family Medicine

## 2021-09-07 ENCOUNTER — Ambulatory Visit (INDEPENDENT_AMBULATORY_CARE_PROVIDER_SITE_OTHER): Payer: 59 | Admitting: Family Medicine

## 2021-09-07 VITALS — BP 118/86 | HR 68 | Temp 97.6°F | Ht 64.0 in | Wt 153.0 lb

## 2021-09-07 DIAGNOSIS — Z1212 Encounter for screening for malignant neoplasm of rectum: Secondary | ICD-10-CM

## 2021-09-07 DIAGNOSIS — F341 Dysthymic disorder: Secondary | ICD-10-CM | POA: Diagnosis not present

## 2021-09-07 DIAGNOSIS — Z1211 Encounter for screening for malignant neoplasm of colon: Secondary | ICD-10-CM

## 2021-09-07 DIAGNOSIS — E28319 Asymptomatic premature menopause: Secondary | ICD-10-CM

## 2021-09-07 DIAGNOSIS — Z23 Encounter for immunization: Secondary | ICD-10-CM | POA: Diagnosis not present

## 2021-09-07 DIAGNOSIS — Z0000 Encounter for general adult medical examination without abnormal findings: Secondary | ICD-10-CM

## 2021-09-07 DIAGNOSIS — Z79890 Hormone replacement therapy (postmenopausal): Secondary | ICD-10-CM

## 2021-09-07 LAB — LIPID PANEL
Cholesterol: 198 mg/dL (ref 0–200)
HDL: 61.7 mg/dL (ref >39.00)
LDL Cholesterol: 121 mg/dL — ABNORMAL HIGH (ref 0–99)
NonHDL: 136.27
Total CHOL/HDL Ratio: 3
Triglycerides: 75 mg/dL (ref 0.0–149.0)
VLDL: 15 mg/dL (ref 0.0–40.0)

## 2021-09-07 LAB — CBC WITH DIFFERENTIAL/PLATELET
Basophils Absolute: 0 10*3/uL (ref 0.0–0.1)
Basophils Relative: 0.8 % (ref 0.0–3.0)
Eosinophils Absolute: 0.1 10*3/uL (ref 0.0–0.7)
Eosinophils Relative: 2.1 % (ref 0.0–5.0)
HCT: 42 % (ref 36.0–46.0)
Hemoglobin: 14.1 g/dL (ref 12.0–15.0)
Lymphocytes Relative: 28.4 % (ref 12.0–46.0)
Lymphs Abs: 1.6 10*3/uL (ref 0.7–4.0)
MCHC: 33.5 g/dL (ref 30.0–36.0)
MCV: 91.7 fl (ref 78.0–100.0)
Monocytes Absolute: 0.5 10*3/uL (ref 0.1–1.0)
Monocytes Relative: 9.1 % (ref 3.0–12.0)
Neutro Abs: 3.3 10*3/uL (ref 1.4–7.7)
Neutrophils Relative %: 59.6 % (ref 43.0–77.0)
Platelets: 203 10*3/uL (ref 150.0–400.0)
RBC: 4.58 Mil/uL (ref 3.87–5.11)
RDW: 13.6 % (ref 11.5–15.5)
WBC: 5.5 10*3/uL (ref 4.0–10.5)

## 2021-09-07 LAB — COMPREHENSIVE METABOLIC PANEL
ALT: 19 U/L (ref 0–35)
AST: 22 U/L (ref 0–37)
Albumin: 4 g/dL (ref 3.5–5.2)
Alkaline Phosphatase: 35 U/L — ABNORMAL LOW (ref 39–117)
BUN: 12 mg/dL (ref 6–23)
CO2: 27 mEq/L (ref 19–32)
Calcium: 8.9 mg/dL (ref 8.4–10.5)
Chloride: 106 mEq/L (ref 96–112)
Creatinine, Ser: 0.86 mg/dL (ref 0.40–1.20)
GFR: 76.89 mL/min (ref >60.00)
Glucose, Bld: 88 mg/dL (ref 70–99)
Potassium: 4 mEq/L (ref 3.5–5.1)
Sodium: 140 mEq/L (ref 135–145)
Total Bilirubin: 0.6 mg/dL (ref 0.2–1.2)
Total Protein: 6.1 g/dL (ref 6.0–8.3)

## 2021-09-07 LAB — TSH: TSH: 3.17 u[IU]/mL (ref 0.35–5.50)

## 2021-09-07 NOTE — Patient Instructions (Signed)
Please return in 12 months for your annual complete physical; please come fasting.   I will release your lab results to you on your MyChart account with further instructions. Please reply with any questions.    We will call you with information regarding your referral appointment. [facility] to get set up for a colonoscopy.  If you do not hear from [place] within the next 2 weeks, please let me know. It can take 1-2 weeks to get appointments set up with the specialists.    Today you were given your flu vaccination.    If you have any questions or concerns, please don't hesitate to send me a message via MyChart or call the office at [phone]. Thank you for visiting with [place] today! It's our pleasure caring for you.   Please do these things to maintain good health!  Exercise at least 30-45 minutes a day,  4-5 days a week.  Eat a low-fat diet with lots of fruits and vegetables, up to 7-9 servings per day. Drink plenty of water daily. Try to drink 8 8oz glasses per day. Seatbelts can save your life. Always wear your seatbelt. Place Smoke Detectors on every level of your home and check batteries every year. Schedule an appointment with an eye doctor for an eye exam every 1-2 years Safe sex - use condoms to protect yourself from STDs if you could be exposed to these types of infections. Use birth control if you do not want to become pregnant and are sexually active. Avoid heavy alcohol use. If you drink, keep it to less than 2 drinks/day and not every day. Health Care Power of Attorney.  Choose someone you trust that could speak for you if you became unable to speak for yourself. Depression is common in our stressful world.If you're feeling down or losing interest in things you normally enjoy, please come in for a visit. If anyone is threatening or hurting you, please get help. Physical or Emotional Violence is never OK.

## 2021-09-07 NOTE — Progress Notes (Signed)
Subjective  Chief Complaint  Patient presents with   Annual Exam    HPI: Kelly Jensen is a 55 y.o. female who presents to [facility] today for a Female Wellness Visit. She also has the concerns and/or needs as listed above in the chief complaint. These will be addressed in addition to the Health Maintenance Visit.   Wellness Visit: annual visit with health maintenance review and exam without Pap  HM: due colonoscopy. Imms up to date. Female wellness per Dr. Philis Pique and current. Feels well.  Chronic disease f/u and/or acute problem visit: (deemed necessary to be done in addition to the wellness visit): Dysthymia: stable on wellbutrin and lexapro (changed from trintellix due to cost). Going through divorce and doing well now.  HRT: stable  Assessment  1. Annual physical exam   2. Need for immunization against influenza   3. Dysthymia   4. Premature menopause on HRT   5. Screening for colorectal cancer      Plan  Female Wellness Visit: Age appropriate Health Maintenance and Prevention measures were discussed with patient. Included topics are cancer screening recommendations, ways to keep healthy (see AVS) including dietary and exercise recommendations, regular eye and dental care, use of seat belts, and avoidance of moderate alcohol use and tobacco use. Refer for crc screen BMI: discussed patient's BMI and encouraged positive lifestyle modifications to help get to or maintain a target BMI. HM needs and immunizations were addressed and ordered. See below for orders. See HM and immunization section for updates. Up to date Routine labs and screening tests ordered including cmp, cbc and lipids where appropriate. Discussed recommendations regarding Vit D and calcium supplementation (see AVS)  Chronic disease management visit and/or acute problem visit: Stabel mood and hrt Follow up: Return in about 1 year (around 09/07/2022) for complete physical.  Orders  Placed This Encounter  Procedures   Flu Vaccine QUAD 74moIM (Fluarix, Fluzone & Alfiuria Quad PF)   CBC with Differential/Platelet   Comprehensive metabolic panel   Lipid panel   Hepatitis C antibody   TSH   Ambulatory referral to Gastroenterology   No orders of the defined types were placed in this encounter.     Body mass index is 26.26 kg/m. 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   Premature menopause on HRT 04/11/2014   Health Maintenance  Topic Date Due   Hepatitis C Screening  Never done   COLONOSCOPY (Pts 45-459yrInsurance coverage will need to be confirmed)  Never done   Zoster Vaccines- Shingrix (2 of 2) 12/14/2019   COVID-19 Vaccine (3 - Booster for Janssen series) 02/24/2021   MAMMOGRAM  08/29/2022   PAP SMEAR-Modifier  08/29/2022   TETANUS/TDAP  05/17/2028   INFLUENZA VACCINE  Completed   HIV Screening  Completed   HPV VACCINES  Aged Out   Immunization History  Administered Date(s) Administered   Hepatitis A, Adult 05/17/2018   Influenza,inj,Quad PF,6+ Mos 09/12/2014, 08/30/2017, 09/17/2018, 10/27/2020, 09/07/2021   Influenza-Unspecified 12/16/2016, 08/30/2017   Janssen (J&J) SARS-COV-2 Vaccination 02/05/2020   MMR 05/17/2018   PFIZER(Purple Top)SARS-COV-2 Vaccination 10/27/2020   Tdap 05/17/2018   Zoster Recombinat (Shingrix) 10/19/2019   We updated and reviewed the patient's past  history in detail and it is documented below. Allergies: Patient has No Known Allergies. Past Medical History Patient  has a past medical history of Anxiety, Dysthymia, Eczema, and Premature menopause on hormone replacement therapy. Past Surgical History Patient  has a past surgical history that includes Tonsillectomy; Cesarean section; Wisdom tooth  extraction; and Adenoidectomy. Family History: Patient family history includes Chromosomal disorder in her daughter; Healthy in her brother, daughter, father, mother, sister, sister, sister, and son; Heart disease in her paternal grandfather. Social History:  Patient  reports that she has never smoked. She has never used smokeless tobacco. She reports current alcohol use of about 2.0 standard drinks per week. She reports that she does not use drugs.  Review of Systems: Constitutional: negative for fever or malaise Ophthalmic: negative for photophobia, double vision or loss of vision Cardiovascular: negative for chest pain, dyspnea on exertion, or new LE swelling Respiratory: negative for SOB or persistent cough Gastrointestinal: negative for abdominal pain, change in bowel habits or melena Genitourinary: negative for dysuria or gross hematuria, no abnormal uterine bleeding or disharge Musculoskeletal: negative for new gait disturbance or muscular weakness Integumentary: negative for new or persistent rashes, no breast lumps Neurological: negative for TIA or stroke symptoms Psychiatric: negative for SI or delusions Allergic/Immunologic: negative for hives  Patient Care Team    Relationship Specialty Notifications Start End  Leamon Arnt, MD PCP - General Family Medicine  03/13/20   Bobbye Charleston, MD Consulting Physician Obstetrics and Gynecology  03/13/20     Objective  Vitals: BP 118/86   Pulse 68   Temp 97.6 F (36.4 C) (Temporal)   Ht 5' 4" (1.626 m)   Wt 153 lb (69.4 kg)   SpO2 95%   BMI 26.26 kg/m  General:  Well developed, well nourished, no acute distress  Psych:  Alert and orientedx3,normal mood and affect HEENT:  Normocephalic, atraumatic, non-icteric sclera,  supple neck without adenopathy, mass or thyromegaly Cardiovascular:  Normal S1, S2, RRR without gallop, rub or murmur Respiratory:  Good breath sounds bilaterally, CTAB with normal respiratory  effort Gastrointestinal: normal bowel sounds, soft, non-tender, no noted masses. No HSM MSK: no deformities, contusions. Joints are without erythema or swelling.  Skin:  Warm, no rashes or suspicious lesions noted Neurologic:    Mental status is normal.   No tremor   Commons side effects, risks, benefits, and alternatives for medications and treatment plan prescribed today were discussed, and the patient expressed understanding of the given instructions. Patient is instructed to call or message via MyChart if he/she has any questions or concerns regarding our treatment plan. No barriers to understanding were identified. We discussed Red Flag symptoms and signs in detail. Patient expressed understanding regarding what to do in case of urgent or emergency type symptoms.  Medication list was reconciled, printed and provided to the patient in AVS. Patient instructions and summary information was reviewed with the patient as documented in the AVS. This note was prepared with assistance of Dragon voice recognition software. Occasional wrong-word or sound-a-like substitutions may have occurred due to the inherent limitations of voice recognition software  This visit occurred during the SARS-CoV-2 public health emergency.  Safety protocols were in place, including screening questions prior to the visit, additional usage of staff PPE, and extensive cleaning of exam room while observing appropriate contact time as indicated for disinfecting solutions.

## 2021-09-08 LAB — HEPATITIS C ANTIBODY
Hepatitis C Ab: NONREACTIVE
SIGNAL TO CUT-OFF: 0.02 (ref <1.00)

## 2021-11-02 IMAGING — CT CT CERVICAL SPINE W/O CM
3 of 4 series · 13 of 33 positions shown, 16 images · non-contrast
Comparison: None.

CLINICAL DATA: Neck pain

EXAM:
CT CERVICAL SPINE WITHOUT CONTRAST
TECHNIQUE: Multidetector CT imaging of the cervical spine was performed without
intravenous contrast. Multiplanar CT image reconstructions were also
generated.

[Series 5: c_spine 2.0 st · axial · 0.33mm/px · z∈[-289,-169]mm · 5 of 90 slices shown, 7 images]
[im 15/90  soft-tissue]
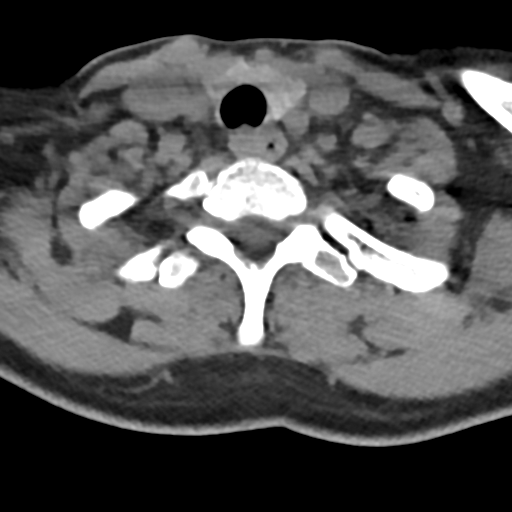
[im 15/90  bone]
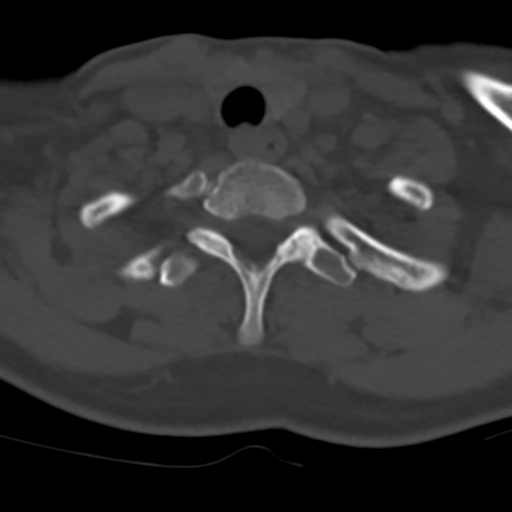
[im 30/90  bone]
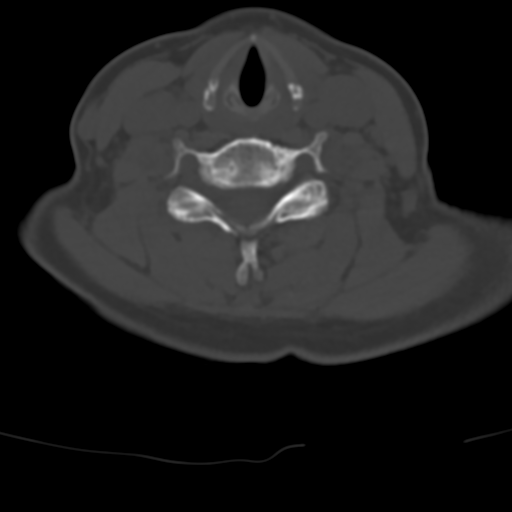
[im 45/90  bone]
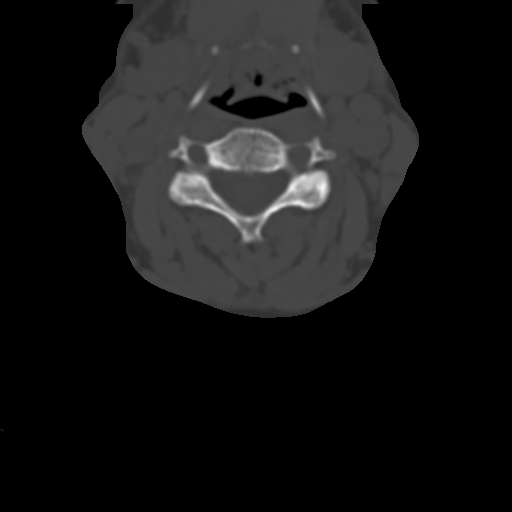
[im 60/90  bone]
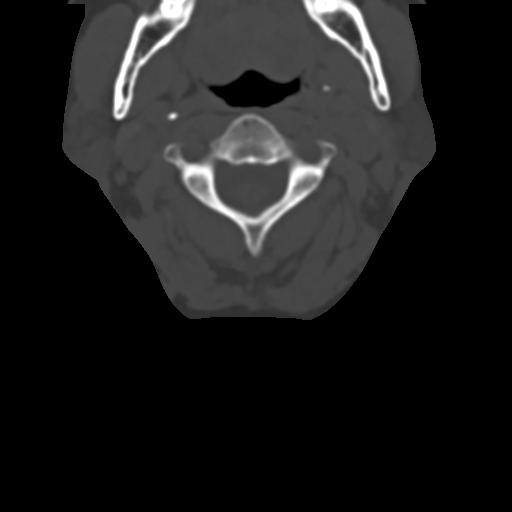
[im 75/90  soft-tissue]
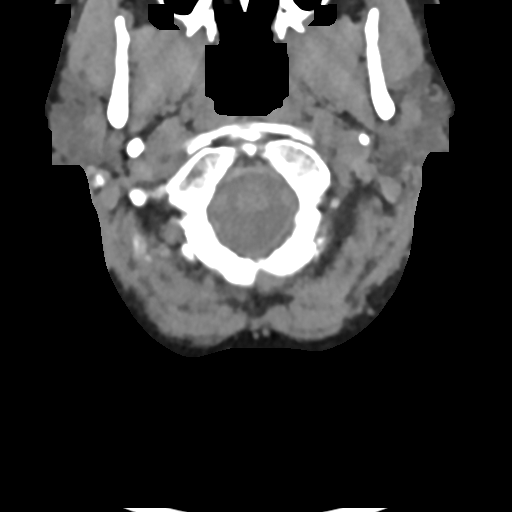
[im 75/90  bone]
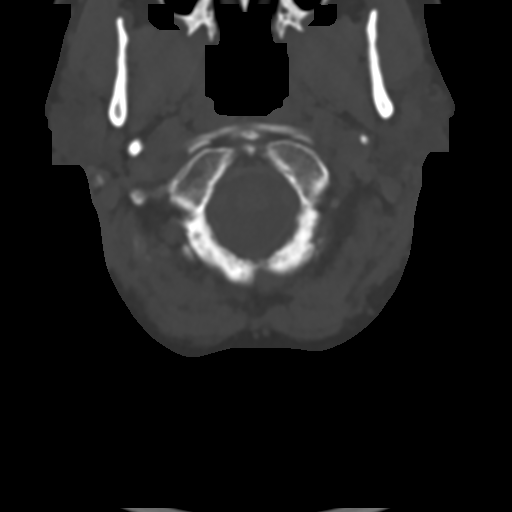

[Series 8: coronal bone · coronal · 0.26mm/px · 3 of 73 slices shown]
[im 15/73  bone]
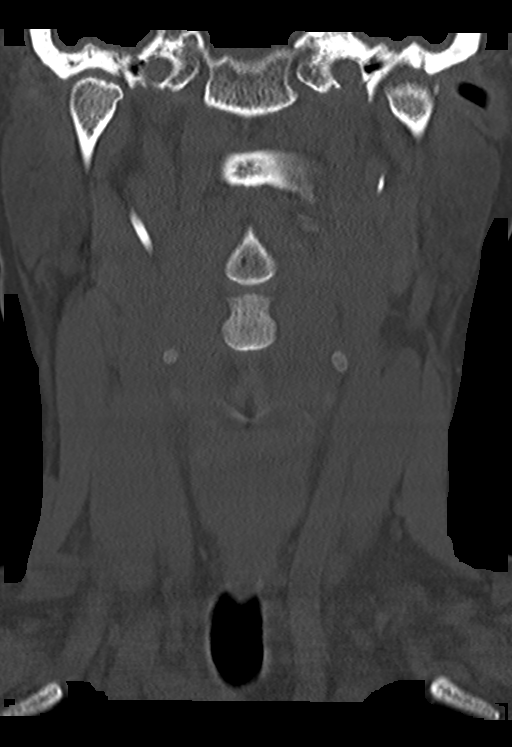
[im 29/73  bone]
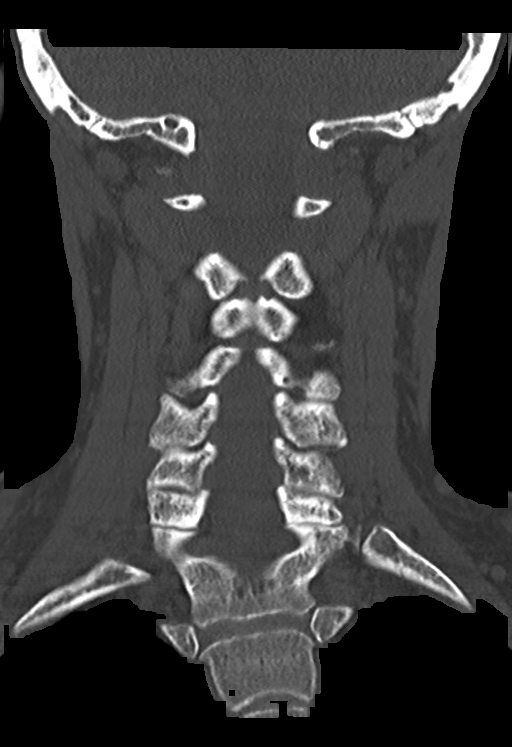
[im 44/73  bone]
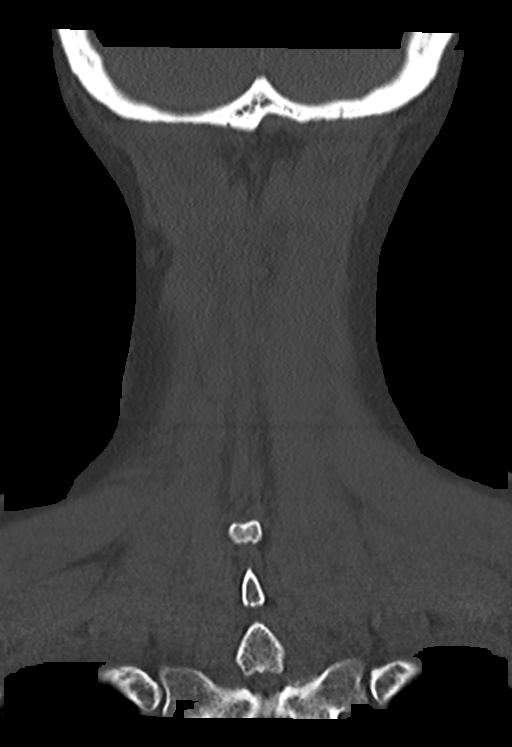

[Series 11: sagittal bone · sagittal · 0.30mm/px · 5 of 61 slices shown, 6 images]
[im 21/61  bone]
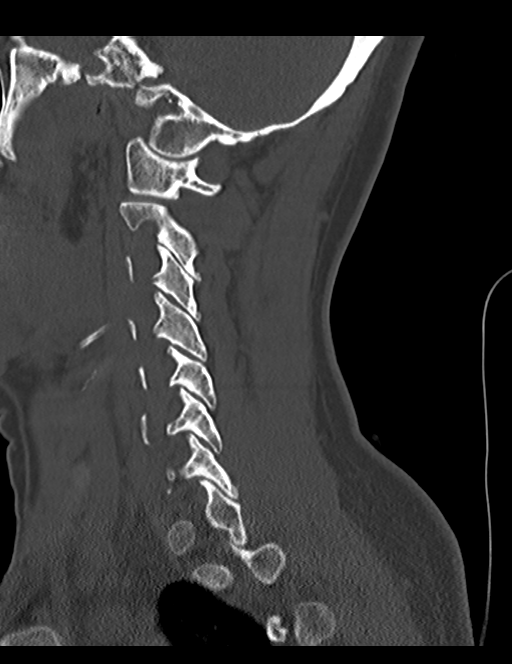
[im 26/61  bone]
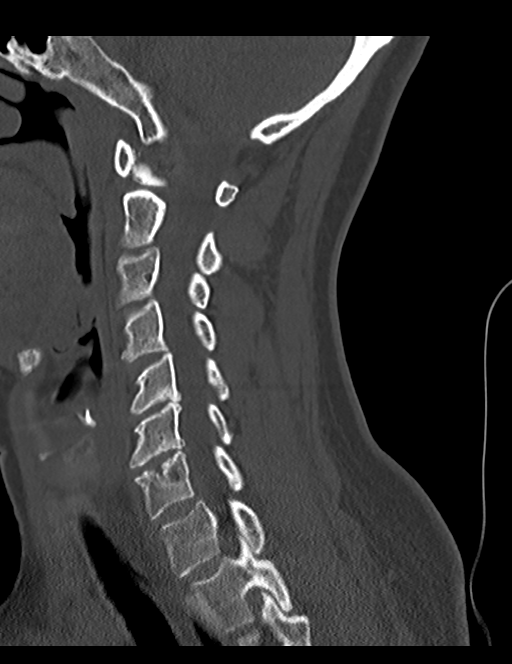
[im 31/61  soft-tissue]
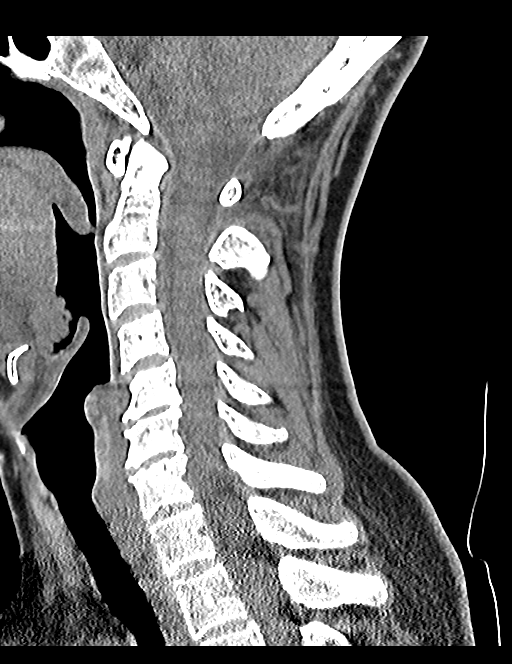
[im 31/61  bone]
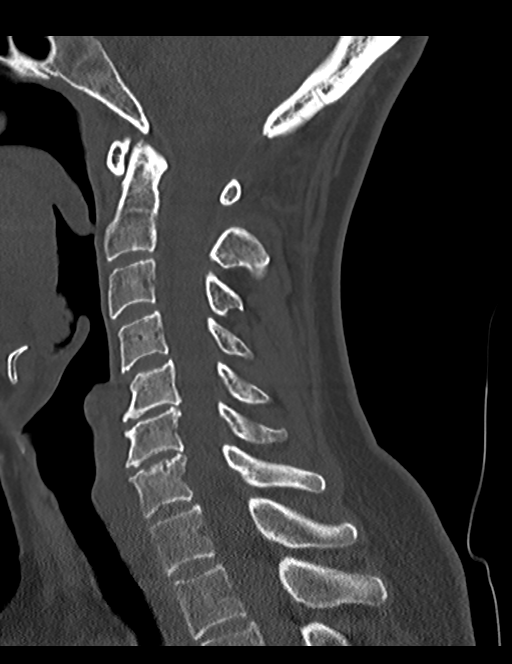
[im 36/61  bone]
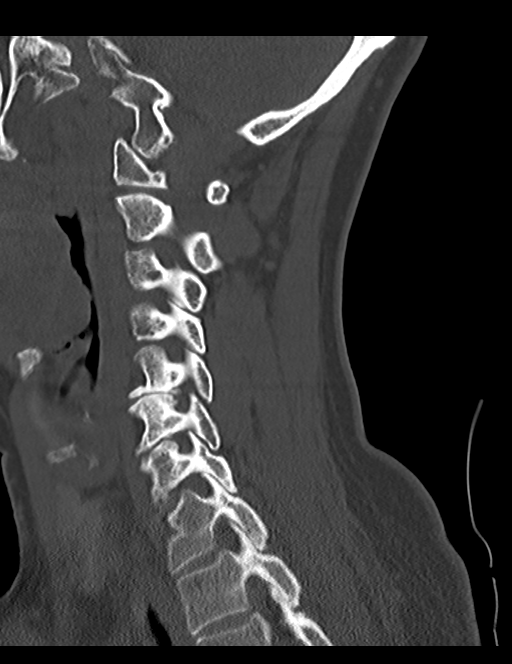
[im 41/61  bone]
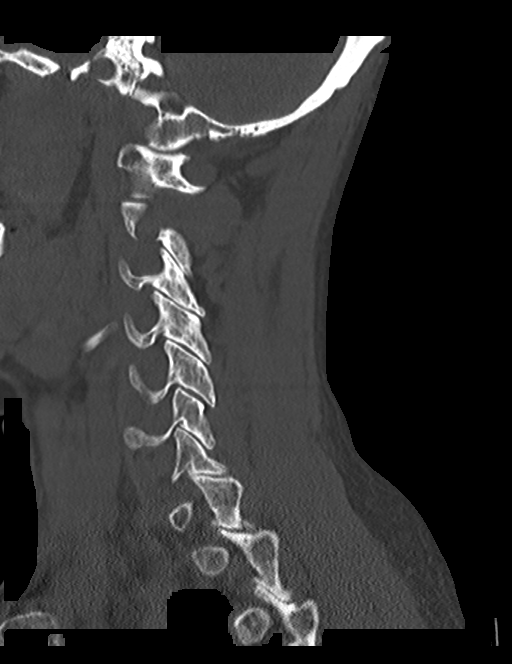

[13 of 33 positions shown; findings below may reference images not displayed]

FINDINGS: Alignment: There is trace retrolisthesis at C5-C6 and C6-C7.

Skull base and vertebrae: Vertebral body heights are maintained
apart from degenerative endplate irregularity. There is no sclerotic
or destructive osseous lesion.

Soft tissues and spinal canal: No prevertebral fluid or swelling. No
visible canal collection.

Disc levels:

C2-C3: Small central disc protrusion and mild facet hypertrophy. No
significant canal or foraminal stenosis.

C3-C4: Small central disc protrusion and mild left greater than
right facet hypertrophy. No significant canal or foraminal stenosis.

C4-C5: Small central disc protrusion. No significant canal or
foraminal stenosis.

C5-C6: Small disc osteophyte complex and uncovertebral hypertrophy.
No significant canal stenosis. Mild foraminal stenosis.

C6-C7: Small disc bulge with endplate osteophytes and uncovertebral
hypertrophy. No significant canal or right foraminal stenosis. Minor
left foraminal stenosis.

C7-T1: Minimal disc bulge and left facet hypertrophy. No significant
canal or foraminal stenosis.

Upper chest: No apical lung mass.

Other: Small subcentimeter thyroid nodules for which no further
follow-up is required.
IMPRESSION: Mild degenerative changes without evidence of high-grade stenosis.
There is mild, left greater than right facet arthropathy.

## 2022-03-12 ENCOUNTER — Other Ambulatory Visit: Payer: Self-pay | Admitting: Family Medicine

## 2022-03-12 DIAGNOSIS — F40243 Fear of flying: Secondary | ICD-10-CM

## 2022-03-12 NOTE — Telephone Encounter (Signed)
Last visit: 09/07/21 ? ?Next visit: none ? ?Last filled: 06/09/21 ? ?Quantity: 30  ?

## 2022-03-14 MED ORDER — ALPRAZOLAM 0.5 MG PO TABS: 0.5000 mg | ORAL_TABLET | Freq: Every evening | ORAL | 0 refills | Status: DC | PRN

## 2022-04-02 NOTE — Telephone Encounter (Signed)
Error

## 2022-05-02 ENCOUNTER — Encounter: Payer: Self-pay | Admitting: Infectious Diseases

## 2022-07-19 ENCOUNTER — Encounter: Payer: Self-pay | Admitting: Internal Medicine

## 2022-08-18 ENCOUNTER — Ambulatory Visit (AMBULATORY_SURGERY_CENTER): Payer: BLUE CROSS/BLUE SHIELD

## 2022-08-18 VITALS — Ht 64.0 in | Wt 174.0 lb

## 2022-08-18 DIAGNOSIS — Z1211 Encounter for screening for malignant neoplasm of colon: Secondary | ICD-10-CM

## 2022-08-18 NOTE — Progress Notes (Signed)
No egg or soy allergy known to patient  No issues known to pt with past sedation with any surgeries or procedures Patient denies ever being told they had issues or difficulty with intubation  No FH of Malignant Hyperthermia Pt is not on diet pills Pt is not on  home 02  Pt is not on blood thinners  Pt denies issues with constipation  No A fib or A flutter Have any cardiac testing pending--denied Pt instructed to use [email or web address] or GoodRx for a price reduction on prep   

## 2022-08-23 ENCOUNTER — Encounter: Payer: Self-pay | Admitting: *Deleted

## 2022-09-07 ENCOUNTER — Encounter: Payer: Self-pay | Admitting: Internal Medicine

## 2022-09-15 NOTE — Progress Notes (Unsigned)
[facility] History and Physical   Primary Care Physician:  Leamon Arnt, MD   Reason for Procedure:   CRCA screening  Plan:    colonoscopy     HPI: Kelly Jensen is a 56 y.o. female here fior screening colonoscopy exam   Past Medical History:  Diagnosis Date   Anxiety    when flying on a plane   Dysthymia    Eczema    Premature menopause on hormone replacement therapy    early 40s/ Dr. Philis Pique    Past Surgical History:  Procedure Laterality Date   ADENOIDECTOMY     [place]      Prior to Admission medications   Medication Sig Start Date End Date Taking? Authorizing Provider  ALPRAZolam Duanne Moron) 0.5 MG tablet Take 1 tablet (0.5 mg total) by mouth at bedtime as needed for anxiety or sleep. Patient not taking: Reported on 08/18/2022 03/14/22   Leamon Arnt, MD  buPROPion (WELLBUTRIN XL) 300 MG 24 hr tablet Take 300 mg by mouth daily.    [provider]  escitalopram (LEXAPRO) 20 MG tablet Take by mouth. 02/11/21   [provider]  HYDROcodone-acetaminophen (NORCO) 5-325 MG tablet Take 1 tablet by mouth every 6 (six) hours as needed for moderate pain. Patient not taking: Reported on 08/18/2022 07/01/21   Leamon Arnt, MD  PRESCRIPTION MEDICATION Apply 1 application topically daily. Testosterone/progesterone cream compounded at [place] [facility] - prescribed by Dr Bobbye Charleston    [provider]  progesterone (PROMETRIUM) 200 MG capsule TAKE 1 [place] Patient taking differently: Take 200 mg by mouth at bedtime. 05/07/19   Briscoe Deutscher, DO    Current Outpatient Medications  Medication Sig Dispense Refill   ALPRAZolam (XANAX) 0.5 MG tablet Take 1 tablet (0.5 mg total) by mouth at bedtime as needed for anxiety or sleep. (Patient not taking: Reported on 08/18/2022) 30 tablet 0   buPROPion (WELLBUTRIN XL) 300 MG 24 hr tablet Take 300 mg by mouth daily.      escitalopram (LEXAPRO) 20 MG tablet Take by mouth.     HYDROcodone-acetaminophen (NORCO) 5-325 MG tablet Take 1 tablet by mouth every 6 (six) hours as needed for moderate pain. (Patient not taking: Reported on 08/18/2022) 20 tablet 0   PRESCRIPTION MEDICATION Apply 1 application topically daily. Testosterone/progesterone cream compounded at [place] - prescribed by Dr Bobbye Charleston     progesterone (PROMETRIUM) 200 MG capsule TAKE 1 CAPSULE BY MOUTH EVERY DAY (Patient taking differently: Take 200 mg by mouth at bedtime.) 90 capsule 1   No current facility-administered medications for this visit.    Allergies as of 09/16/2022   (No Known Allergies)    Family History  Problem Relation Age of Onset   Healthy Mother    Healthy Father    Healthy Sister    Healthy Brother    Healthy Daughter    Healthy Son    Heart disease Paternal Grandfather    Healthy Sister    Healthy Sister    Chromosomal disorder Daughter    Colon cancer Neg Hx    Colon polyps Neg Hx    Esophageal cancer Neg Hx    Rectal cancer Neg Hx    Stomach cancer Neg Hx    Hypertension Neg Hx    Hyperlipidemia Neg Hx    Diabetes Neg Hx     Social History  Socioeconomic History   Marital status: Married    Spouse name: Dr. Jones Skene   Number of children: 3   Years of education: Not on file   Highest education level: Not on file  Occupational History   Occupation: former attorney   Occupation: [employer]  Tobacco Use   Smoking status: Never   Smokeless tobacco: Never  Vaping Use   Vaping Use: Never used  Substance and Sexual Activity   Alcohol use: Yes    Alcohol/week: 2.0 standard drinks of alcohol    Types: 2 Glasses of wine per week   Drug use: Never   Sexual activity: Yes    Birth control/protection: Post-menopausal  Other Topics Concern   Not on file  Social History Narrative   Originally from [place]; family lives there; law school in [place] and then lived in  [place]; moved to [place] 2010; 2 living biologic children, one adopted daughter from [place]. Daughter died at 33mo due to chromosomal disorder.    Social Determinants of Health   Financial Resource Strain: Not on file  Food Insecurity: Not on file  Transportation Needs: Not on file  Physical Activity: Not on file  Stress: Not on file  Social Connections: Not on file  Intimate Partner Violence: Not on file    Review of Systems: Positive for *** All other review of systems negative except as mentioned in the HPI.  Physical Exam: Vital signs There were no vitals taken for this visit.  General:   Alert,  Well-developed, well-nourished, pleasant and cooperative in NAD Lungs:  Clear throughout to auscultation.   Heart:  Regular rate and rhythm; no murmurs, clicks, rubs,  or gallops. Abdomen:  Soft, nontender and nondistended. Normal bowel sounds.   Neuro/Psych:  Alert and cooperative. Normal mood and affect. A and O x 3   @Samari Bittinger  Simonne Maffucci, MD, [facility] [phone] (pager) 09/15/2022 8:25 PM@

## 2022-09-16 ENCOUNTER — Ambulatory Visit (AMBULATORY_SURGERY_CENTER): Payer: BLUE CROSS/BLUE SHIELD | Admitting: Internal Medicine

## 2022-09-16 ENCOUNTER — Encounter: Payer: Self-pay | Admitting: Internal Medicine

## 2022-09-16 VITALS — BP 103/72 | HR 62 | Temp 96.6°F | Resp 13 | Ht 64.0 in | Wt 174.0 lb

## 2022-09-16 DIAGNOSIS — Z1211 Encounter for screening for malignant neoplasm of colon: Secondary | ICD-10-CM | POA: Diagnosis present

## 2022-09-16 MED ORDER — SODIUM CHLORIDE 0.9 % IV SOLN
500.0000 mL | Freq: Once | INTRAVENOUS | Status: DC
Start: 2022-09-16 — End: 2022-09-16

## 2022-09-16 NOTE — Patient Instructions (Addendum)
No polyps or cancer found - normal colonoscopy.  Next routine colonoscopy or other screening test in 10 years - 2033.  I appreciate the opportunity to care for you. Gatha Mayer, MD, [facility]  There were no polyps seen today!  You will need another screening colonoscopy in 10 years, you will receive a letter at that time when you are due for the procedure.   Please call us at [phone] if you have a change in bowel habits, change in family history of colo-rectal cancer, rectal bleeding or other GI concern before that time.   YOU HAD AN ENDOSCOPIC PROCEDURE TODAY AT [facility]:   Refer to the procedure report that was given to you for any specific questions about what was found during the examination.  If the procedure report does not answer your questions, please call your gastroenterologist to clarify.  If you requested that your care partner not be given the details of your procedure findings, then the procedure report has been included in a sealed envelope for you to review at your convenience later.  YOU SHOULD EXPECT: Some feelings of bloating in the abdomen. Passage of more gas than usual.  Walking can help get rid of the air that was put into your GI tract during the procedure and reduce the bloating. If you had a lower endoscopy (such as a colonoscopy or flexible sigmoidoscopy) you may notice spotting of blood in your stool or on the toilet paper. If you underwent a bowel prep for your procedure, you may not have a normal bowel movement for a few days.  Please Note:  You might notice some irritation and congestion in your nose or some drainage.  This is from the oxygen used during your procedure.  There is no need for concern and it should clear up in a day or so.  SYMPTOMS TO REPORT IMMEDIATELY:  Following lower endoscopy (colonoscopy):  Excessive amounts of blood in the stool  Significant tenderness or worsening of abdominal pains  Swelling of the abdomen that is  new, acute  Fever of 100F or higher  For urgent or emergent issues, a gastroenterologist can be reached at any hour by calling [phone]. Do not use MyChart messaging for urgent concerns.    DIET:  We do recommend a small meal at first, but then you may proceed to your regular diet.  Drink plenty of fluids but you should avoid alcoholic beverages for 24 hours.  ACTIVITY:  You should plan to take it easy for the rest of today and you should NOT DRIVE or use heavy machinery until tomorrow (because of the sedation medicines used during the test).    FOLLOW UP: Our staff will call the number listed on your records the next business day following your procedure.  We will call around 7:15- 8:00 am to check on you and address any questions or concerns that you may have regarding the information given to you following your procedure. If we do not reach you, we will leave a message.      SIGNATURES/CONFIDENTIALITY: You and/or your care partner have signed paperwork which will be entered into your electronic medical record.  These signatures attest to the fact that that the information above on your After Visit Summary has been reviewed and is understood.  Full responsibility of the confidentiality of this discharge information lies with you and/or your care-partner.

## 2022-09-16 NOTE — Progress Notes (Signed)
Pt's states no medical or surgical changes since previsit or office visit. 

## 2022-09-16 NOTE — Progress Notes (Signed)
Report to PACU, RN, vss, BBS= Clear.  

## 2022-09-16 NOTE — Op Note (Signed)
[facility] Patient Name: Kelly Jensen Procedure Date: 09/16/2022 10:30 AM MRN: [id number] Endoscopist: Gatha Mayer , MD Age: 56 Referring MD:  Date of Birth: Mar 06, 1966 Gender: Female Account #: [id number] Procedure:                Colonoscopy Indications:              Screening for colorectal malignant neoplasm, This                            is the patient's first colonoscopy Medicines:                Monitored Anesthesia Care Procedure:                Pre-Anesthesia Assessment:                           - Prior to the procedure, a History and Physical                            was performed, and patient medications and                            allergies were reviewed. The patient's tolerance of                            previous anesthesia was also reviewed. The risks                            and benefits of the procedure and the sedation                            options and risks were discussed with the patient.                            All questions were answered, and informed consent                            was obtained. Prior Anticoagulants: The patient has                            taken no previous anticoagulant or antiplatelet                            agents. ASA Grade Assessment: II - A patient with                            mild systemic disease. After reviewing the risks                            and benefits, the patient was deemed in                            satisfactory condition to undergo the procedure.  After obtaining informed consent, the colonoscope                            was passed under direct vision. Throughout the                            procedure, the patient's blood pressure, pulse, and                            oxygen saturations were monitored continuously. The                            Olympus [id number]) Colonoscope was                            introduced through the  anus and advanced to the the                            cecum, identified by appendiceal orifice and                            ileocecal valve. The colonoscopy was performed                            without difficulty. The patient tolerated the                            procedure well. The quality of the bowel                            preparation was good. The ileocecal valve,                            appendiceal orifice, and rectum were photographed. Scope In: 10:36:33 AM Scope Out: 10:50:31 AM Scope Withdrawal Time: 0 hours 9 minutes 47 seconds  Total Procedure Duration: 0 hours 13 minutes 58 seconds  Findings:                 The perianal and digital rectal examinations were                            normal.                           The entire examined colon appeared normal on direct                            and retroflexion views. Complications:            No immediate complications. Estimated Blood Loss:     Estimated blood loss: none. Impression:               - The entire examined colon is normal on direct and                            retroflexion views.                           -  No specimens collected. Recommendation:           - Patient has a contact number available for                            emergencies. The signs and symptoms of potential                            delayed complications were discussed with the                            patient. Return to normal activities tomorrow.                            Written discharge instructions were provided to the                            patient.                           - Resume previous diet.                           - Continue present medications.                           - Repeat colonoscopy in 10 years for screening                            purposes. Gatha Mayer, MD 09/16/2022 10:57:36 AM This report has been signed electronically.

## 2022-09-17 ENCOUNTER — Telehealth: Payer: Self-pay

## 2022-09-17 NOTE — Telephone Encounter (Signed)
  Follow up Call-     09/16/2022   10:19 AM  Call back number  Post procedure Call Back phone  # [phone]  Permission to leave phone message Yes   Follow up call, LVM

## 2022-11-11 ENCOUNTER — Encounter: Payer: Self-pay | Admitting: *Deleted

## 2022-11-17 ENCOUNTER — Other Ambulatory Visit: Payer: Self-pay | Admitting: Family Medicine

## 2022-11-17 DIAGNOSIS — F40243 Fear of flying: Secondary | ICD-10-CM

## 2023-07-05 DIAGNOSIS — N3941 Urge incontinence: Secondary | ICD-10-CM | POA: Diagnosis not present

## 2023-07-05 DIAGNOSIS — Z1231 Encounter for screening mammogram for malignant neoplasm of breast: Secondary | ICD-10-CM | POA: Diagnosis not present

## 2023-07-05 DIAGNOSIS — Z01419 Encounter for gynecological examination (general) (routine) without abnormal findings: Secondary | ICD-10-CM | POA: Diagnosis not present

## 2023-07-05 DIAGNOSIS — Z6829 Body mass index (BMI) 29.0-29.9, adult: Secondary | ICD-10-CM | POA: Diagnosis not present

## 2023-07-06 LAB — HM MAMMOGRAPHY

## 2023-07-08 ENCOUNTER — Other Ambulatory Visit: Payer: Self-pay | Admitting: Obstetrics and Gynecology

## 2023-07-08 DIAGNOSIS — R928 Other abnormal and inconclusive findings on diagnostic imaging of breast: Secondary | ICD-10-CM

## 2023-07-19 ENCOUNTER — Ambulatory Visit
Admission: RE | Admit: 2023-07-19 | Discharge: 2023-07-19 | Disposition: A | Discharge status: Home / Self Care | Payer: BLUE CROSS/BLUE SHIELD | Source: Ambulatory Visit | Attending: Obstetrics and Gynecology | Admitting: Obstetrics and Gynecology

## 2023-07-19 ENCOUNTER — Ambulatory Visit
Admission: RE | Admit: 2023-07-19 | Discharge: 2023-07-19 | Disposition: A | Discharge status: Home / Self Care | Payer: BC Managed Care – PPO | Source: Ambulatory Visit | Attending: Obstetrics and Gynecology | Admitting: Obstetrics and Gynecology

## 2023-07-19 DIAGNOSIS — R928 Other abnormal and inconclusive findings on diagnostic imaging of breast: Secondary | ICD-10-CM

## 2023-07-19 DIAGNOSIS — N6012 Diffuse cystic mastopathy of left breast: Secondary | ICD-10-CM | POA: Diagnosis not present

## 2023-08-23 DIAGNOSIS — L820 Inflamed seborrheic keratosis: Secondary | ICD-10-CM | POA: Diagnosis not present

## 2023-12-22 DIAGNOSIS — H5213 Myopia, bilateral: Secondary | ICD-10-CM | POA: Diagnosis not present

## 2023-12-22 DIAGNOSIS — H43813 Vitreous degeneration, bilateral: Secondary | ICD-10-CM | POA: Diagnosis not present

## 2024-05-08 ENCOUNTER — Encounter: Payer: Self-pay | Admitting: Family Medicine

## 2024-05-08 ENCOUNTER — Ambulatory Visit (INDEPENDENT_AMBULATORY_CARE_PROVIDER_SITE_OTHER): Admitting: Family Medicine

## 2024-05-08 VITALS — BP 88/60 | HR 80 | Temp 98.2°F | Ht 64.0 in | Wt 171.6 lb

## 2024-05-08 DIAGNOSIS — Z79890 Hormone replacement therapy (postmenopausal): Secondary | ICD-10-CM

## 2024-05-08 DIAGNOSIS — L709 Acne, unspecified: Secondary | ICD-10-CM | POA: Insufficient documentation

## 2024-05-08 DIAGNOSIS — F40243 Fear of flying: Secondary | ICD-10-CM | POA: Diagnosis not present

## 2024-05-08 DIAGNOSIS — E28319 Asymptomatic premature menopause: Secondary | ICD-10-CM | POA: Diagnosis not present

## 2024-05-08 DIAGNOSIS — Z0000 Encounter for general adult medical examination without abnormal findings: Secondary | ICD-10-CM

## 2024-05-08 DIAGNOSIS — F339 Major depressive disorder, recurrent, unspecified: Secondary | ICD-10-CM | POA: Diagnosis not present

## 2024-05-08 DIAGNOSIS — E669 Obesity, unspecified: Secondary | ICD-10-CM

## 2024-05-08 DIAGNOSIS — J3489 Other specified disorders of nose and nasal sinuses: Secondary | ICD-10-CM

## 2024-05-08 DIAGNOSIS — L710 Perioral dermatitis: Secondary | ICD-10-CM | POA: Insufficient documentation

## 2024-05-08 DIAGNOSIS — L309 Dermatitis, unspecified: Secondary | ICD-10-CM | POA: Insufficient documentation

## 2024-05-08 LAB — COMPREHENSIVE METABOLIC PANEL WITH GFR
ALT: 16 U/L (ref 0–35)
AST: 18 U/L (ref 0–37)
Albumin: 4.3 g/dL (ref 3.5–5.2)
Alkaline Phosphatase: 46 U/L (ref 39–117)
BUN: 13 mg/dL (ref 6–23)
CO2: 26 meq/L (ref 19–32)
Calcium: 9.3 mg/dL (ref 8.4–10.5)
Chloride: 107 meq/L (ref 96–112)
Creatinine, Ser: 0.76 mg/dL (ref 0.40–1.20)
GFR: 87.53 mL/min (ref >60.00)
Glucose, Bld: 108 mg/dL — ABNORMAL HIGH (ref 70–99)
Potassium: 3.8 meq/L (ref 3.5–5.1)
Sodium: 140 meq/L (ref 135–145)
Total Bilirubin: 0.7 mg/dL (ref 0.2–1.2)
Total Protein: 6.6 g/dL (ref 6.0–8.3)

## 2024-05-08 LAB — LIPID PANEL
Cholesterol: 187 mg/dL (ref 0–200)
HDL: 45.8 mg/dL (ref >39.00)
LDL Cholesterol: 117 mg/dL — ABNORMAL HIGH (ref 0–99)
NonHDL: 141.43
Total CHOL/HDL Ratio: 4
Triglycerides: 123 mg/dL (ref 0.0–149.0)
VLDL: 24.6 mg/dL (ref 0.0–40.0)

## 2024-05-08 LAB — CBC WITH DIFFERENTIAL/PLATELET
Basophils Absolute: 0.1 10*3/uL (ref 0.0–0.1)
Basophils Relative: 1.3 % (ref 0.0–3.0)
Eosinophils Absolute: 0.2 10*3/uL (ref 0.0–0.7)
Eosinophils Relative: 2.7 % (ref 0.0–5.0)
HCT: 43.5 % (ref 36.0–46.0)
Hemoglobin: 14.8 g/dL (ref 12.0–15.0)
Lymphocytes Relative: 27.8 % (ref 12.0–46.0)
Lymphs Abs: 1.8 10*3/uL (ref 0.7–4.0)
MCHC: 34.1 g/dL (ref 30.0–36.0)
MCV: 89.2 fl (ref 78.0–100.0)
Monocytes Absolute: 0.5 10*3/uL (ref 0.1–1.0)
Monocytes Relative: 7.8 % (ref 3.0–12.0)
Neutro Abs: 3.9 10*3/uL (ref 1.4–7.7)
Neutrophils Relative %: 60.4 % (ref 43.0–77.0)
Platelets: 231 10*3/uL (ref 150.0–400.0)
RBC: 4.88 Mil/uL (ref 3.87–5.11)
RDW: 13.5 % (ref 11.5–15.5)
WBC: 6.5 10*3/uL (ref 4.0–10.5)

## 2024-05-08 MED ORDER — ALPRAZOLAM 0.5 MG PO TABS: 0.5000 mg | ORAL_TABLET | Freq: Every day | ORAL | 0 refills | Status: AC | PRN

## 2024-05-08 NOTE — Progress Notes (Signed)
 Subjective  Chief Complaint  Patient presents with   Annual Exam    Pt here for Annual Exam and is not currently fasting     HPI: Kelly Jensen is a 58 y.o. female who presents to [facility] [place] today for a Female Wellness Visit. She also has the concerns and/or needs as listed above in the chief complaint. These will be addressed in addition to the Health Maintenance Visit.   Wellness Visit: annual visit with health maintenance review and exam  HM: last cpe 2022.  Has since divorced.  She is thriving.  Sees Dr. Leslee Rase for GYN care.  Pap is due and she has an appointment in August.  Mammogram is current.  Colorectal cancer screens are current.  Normal colonoscopy in 2023.  Immunizations are up-to-date.  Healthy lifestyle, walks about 5 miles per day.  Eats well.  Some strength training at a home gym.  2 grown sons were out of the home.  1 adopted daughter who is finishing high school. Chronic disease f/u and/or acute problem visit: (deemed necessary to be done in addition to the wellness visit): Chronic depression: Well-controlled.  Now on Lexapro 20 mg daily.  No longer needing Wellbutrin.  Feels great.  No adverse effects.  No apathy.  We did discuss possible negative effect of weight gain. Premature menopause on bioidentical HRT [facility].  Very stable.  Sees GYN.  No adverse effects.  No postmenopausal bleeding.  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  Request refill.  Last refilled in 2023 Obesity: She has had significant weight gain over the last 10 years.  Likely multifactorial due to menopause, Lexapro use and stress.  Inquires about options of weight control and weight loss.  She walks 5 8 miles per day.  Exercises regularly.  Eats a clean diet.  Has been unable to lose weight.  Has never used medications for weight loss in the past.  No contraindications to GLP-1's Nasal lesion outside of right nare: Present for years.   Has been biopsied in the past.  Benign.  However very irritated.  Causing daily symptoms.  Assessment  1. Annual physical exam   2. Major depression, recurrent, chronic (HCC)   3. Premature menopause on HRT   4. Fear of flying   5. Obesity (BMI 30-39.9)   6. Nasal lesion      Plan  Female Wellness Visit: Age appropriate Health Maintenance and Prevention measures were discussed with patient. Included topics are cancer screening recommendations, ways to keep healthy (see AVS) including dietary and exercise recommendations, regular eye and dental care, use of seat belts, and avoidance of moderate alcohol use and tobacco use.  Pap smear due to see GYN in August.  Mammo current.  Colonoscopy current. BMI: discussed patient's BMI and encouraged positive lifestyle modifications to help get to or maintain a target BMI. HM needs and immunizations were addressed and ordered. See below for orders. See HM and immunization section for updates.  All up-to-date Routine labs and screening tests ordered including cmp, cbc and lipids where appropriate. Discussed recommendations regarding Vit D and calcium supplementation (see AVS)  Chronic disease management visit and/or acute problem visit: Obesity: Had long discussion regarding options of care.  She has failed conservative measures.  Recommend GLP-1.  She will check for insurance coverage.  Recommend Zepbound through [place] [facility] if needed.  Discussed risk benefits and expectations.  Check vitamin D levels Post menopause  HRT: Stable. Depression: Well-controlled.  Continue Lexapro but consider decreasing to 10 mg daily.  Improving. Fear of flying: Xanax  refilled 0.5 mg as needed.  #30 Nasal lesion: Recommend follow-up with dermatology for excision.  Follow up: 3 months for follow-up weight and mood, 1 year for physical Orders Placed This Encounter  Procedures   CBC with Differential/Platelet   Comprehensive metabolic panel with GFR   Lipid panel   TSH    VITAMIN D 25 Hydroxy (Vit-D Deficiency, Fractures)   Meds ordered this encounter  Medications   ALPRAZolam  (XANAX ) 0.5 MG tablet    Sig: Take 1 tablet (0.5 mg total) by mouth daily as needed (flying related anxiety).    Dispense:  30 tablet    Refill:  0      Body mass index is 29.46 kg/m. Wt Readings from Last 3 Encounters:  05/08/24 171 lb 9.6 oz (77.8 kg)  09/16/22 174 lb (78.9 kg)  08/18/22 174 lb (78.9 kg)     Patient Active Problem List   Diagnosis Date Noted Date Diagnosed   Major depression, recurrent, chronic (HCC) 03/13/2020     Priority: High    Adulthood; has been on several SSRIs. Does well on chronic lexapro 20; has needed addition of wellbutrin xl 300 during divorce.     Premature menopause on HRT 04/11/2014     Priority: High    Dr. Leslee Rase, test/estrogen creams, [facility] Oral prometrium     Acne 05/08/2024     Priority: Low   Eczema 05/08/2024     Priority: Low   Perioral dermatitis 05/08/2024     Priority: Low   Ehrlichiosis 05/24/2020     Priority: Low   Fear of flying 03/13/2020     Priority: Low   Health Maintenance  Topic Date Due   Cervical Cancer Screening (HPV/Pap Cotest)  05/08/2024   COVID-19 Vaccine (3 - 2024-25 season) 05/24/2024 (Originally 07/31/2023)   INFLUENZA VACCINE  06/29/2024   MAMMOGRAM  07/05/2024   DTaP/Tdap/Td (2 - Td or Tdap) 05/17/2028   Colonoscopy  09/16/2032   Hepatitis C Screening  Completed   HIV Screening  Completed   Zoster Vaccines- Shingrix  Completed   HPV VACCINES  Aged Out   Meningococcal B Vaccine  Aged Out   Immunization History  Administered Date(s) Administered   Hepatitis A, Adult 05/17/2018   Influenza,inj,Quad PF,6+ Mos 09/12/2014, 08/30/2017, 09/17/2018, 10/27/2020, 09/07/2021   Influenza-Unspecified 12/16/2016, 08/30/2017   Janssen (J&J) SARS-COV-2 Vaccination 02/05/2020   MMR 05/17/2018   PFIZER(Purple Top)SARS-COV-2 Vaccination 10/27/2020   Tdap 05/17/2018   Zoster  Recombinant(Shingrix) 10/19/2019, 04/29/2020   We updated and reviewed the patient's past history in detail and it is documented below. Allergies: Patient has no known allergies. Past Medical History Patient  has a past medical history of Anxiety, Dysthymia, Eczema, and Premature menopause on hormone replacement therapy. Past Surgical History Patient  has a past surgical history that includes Tonsillectomy; Cesarean section; Wisdom tooth extraction; Adenoidectomy; and Colonoscopy. Family History: Patient family history includes Chromosomal disorder in her daughter; Healthy in her brother, daughter, father, mother, sister, sister, sister, and son; Heart disease in her paternal grandfather. Social History:  Patient  reports that she has never smoked. She has never used smokeless tobacco. She reports current alcohol use of about 2.0 standard drinks of alcohol per week. She reports that she does not use drugs.  Review of Systems: Constitutional: negative for fever or malaise Ophthalmic: negative for photophobia, double vision or loss of vision Cardiovascular:  negative for chest pain, dyspnea on exertion, or new LE swelling Respiratory: negative for SOB or persistent cough Gastrointestinal: negative for abdominal pain, change in bowel habits or melena Genitourinary: negative for dysuria or gross hematuria, no abnormal uterine bleeding or disharge Musculoskeletal: negative for new gait disturbance or muscular weakness Integumentary: negative for new or persistent rashes, no breast lumps Neurological: negative for TIA or stroke symptoms Psychiatric: negative for SI or delusions Allergic/Immunologic: negative for hives  Patient Care Team    Relationship Specialty Notifications Start End  Luevenia Saha, MD PCP - General Family Medicine  03/13/20   Matt Song, MD Consulting Physician Obstetrics and Gynecology  03/13/20     Objective  Vitals: BP (!) 88/60   Pulse 80   Temp 98.2 F  (36.8 C)   Ht 5\' 4"  (1.626 m)   Wt 171 lb 9.6 oz (77.8 kg)   SpO2 96%   BMI 29.46 kg/m  General:  Well developed, well nourished, no acute distress  Psych:  Alert and orientedx3,normal mood and affect HEENT:  Normocephalic, atraumatic, non-icteric sclera,  supple neck without adenopathy, mass or thyromegaly, small flesh toned papule on external right nose Cardiovascular:  Normal S1, S2, RRR without gallop, rub or murmur Respiratory:  Good breath sounds bilaterally, CTAB with normal respiratory effort Gastrointestinal: normal bowel sounds, soft, non-tender, no noted masses. No HSM MSK: extremities without edema, joints without erythema or swelling Neurologic:    Mental status is normal.  Gross motor and sensory exams are normal.  No tremor  Commons side effects, risks, benefits, and alternatives for medications and treatment plan prescribed today were discussed, and the patient expressed understanding of the given instructions. Patient is instructed to call or message via MyChart if he/she has any questions or concerns regarding our treatment plan. No barriers to understanding were identified. We discussed Red Flag symptoms and signs in detail. Patient expressed understanding regarding what to do in case of urgent or emergency type symptoms.  Medication list was reconciled, printed and provided to the patient in AVS. Patient instructions and summary information was reviewed with the patient as documented in the AVS. This note was prepared with assistance of Dragon voice recognition software. Occasional wrong-word or sound-a-like substitutions may have occurred due to the inherent limitations of voice recognition software

## 2024-05-08 NOTE — Patient Instructions (Signed)
 Please return in 3 months for follow up on mood and weight  I will release your lab results to you on your MyChart account with further instructions. You may see the results before I do, but when I review them I will send you a message with my report or have my assistant call you if things need to be discussed. Please reply to my message with any questions. Thank you!   If you have any questions or concerns, please don't hesitate to send me a message via MyChart or call the office at [phone]. Thank you for visiting with us  today! It's our pleasure caring for you.    VISIT SUMMARY: Today, you came in for your annual physical exam. We discussed several health concerns, including a persistent nasal lesion, your ongoing management of dysthymia, and weight management. We also reviewed your general health maintenance and upcoming screenings.  YOUR PLAN: -NASAL LESION: You have a chronic nasal lesion that has been present for about ten years. It is likely a cystic lesion with inflammation, causing irritation. We will refer you to a dermatologist for evaluation and potential removal to address the persistent discomfort.  -DYSTHYMIA or chronic depression: Dysthymia is a long-term form of depression. You have been managing it well with escitalopram 20 mg daily. We discussed the possibility of gradually reducing your dose to 10 mg to help with weight management, but only if you feel comfortable with this change.  -WEIGHT MANAGEMENT: Your weight gain may be related to menopause, your medication, and lifestyle factors. We discussed the option of starting a GLP-1 receptor agonist called Zepbound to help with weight loss. You should contact your insurance to check coverage for either Zepbound or Wegovy, and be aware of potential side effects like nausea, bowel changes, fatigue, and headaches. We will follow up in 3 months to see how you are doing. Let me know and I will order it for you through the local  pharmacy OR throught [employer] self pay program for Zepbound. Message me with any questions.  -GENERAL HEALTH MAINTENANCE: You are up to date with most of your health screenings. You are due for a Pap smear and mammogram in August. We also completed your shingles vaccination series. We will perform blood work as part of your physical examination today.  INSTRUCTIONS: Please schedule your Pap smear and mammogram for August. Contact your insurance to check coverage for the GLP-1 receptor agonist (Zepbound). We will follow up in 3 months to assess your progress with the new medication and any dose adjustments to your current medication.                      Contains text generated by Abridge.                                 Contains text generated by Abridge.

## 2024-05-09 ENCOUNTER — Other Ambulatory Visit: Payer: Self-pay | Admitting: Family

## 2024-05-09 MED ORDER — TIRZEPATIDE-WEIGHT MANAGEMENT 2.5 MG/0.5ML ~~LOC~~ SOLN: 2.5000 mg | SUBCUTANEOUS | 1 refills | Status: DC

## 2024-05-10 LAB — TSH: TSH: 3.02 u[IU]/mL (ref 0.35–5.50)

## 2024-05-15 ENCOUNTER — Telehealth: Payer: Self-pay

## 2024-05-15 NOTE — Telephone Encounter (Signed)
 Copied from CRM [id number]. Topic: General - Call Back - No Documentation >> May 15, 2024 11:14 AM Dewanda Foots wrote: Reason for CRM: Pt is returning phone call to [place]. States she has some additional questions for her and would like her to call her back please.  Patient callback number is [phone]  Thank you so much!  I have spoken with pt and I will address this when Jonelle Neri returns. Pt has been made aware

## 2024-05-15 NOTE — Telephone Encounter (Signed)
 Copied from CRM [id number]. Topic: Clinical - Prescription Issue >> May 14, 2024  9:32 AM Bridgette Campus T wrote: Reason for CRM: tirzepatide  (ZEPBOUND ) 2.5 MG/0.5ML injection vial- pharmacy states patient needs to contact office- did not say what issue was - [phone]  I spoke with pt to let her know that she will need to contact [employer] for them to fax her a copy of the application or she might be able to do it online or she could call them that ask any questions needs needs answered to try an get approved for Zepbound . Pt understood. I provided pt with phone # and told her I would wait for her call to f/U with application.

## 2024-05-17 ENCOUNTER — Ambulatory Visit: Payer: Self-pay | Admitting: Family Medicine

## 2024-05-17 NOTE — Progress Notes (Signed)
 Labs reviewed.  The ASCVD Risk score (Arnett DK, et al., 2019) failed to calculate for the following reasons:   The valid systolic blood pressure range is 90 to 200 mmHg

## 2024-05-21 MED ORDER — TIRZEPATIDE-WEIGHT MANAGEMENT 2.5 MG/0.5ML ~~LOC~~ SOLN: 2.5000 mg | SUBCUTANEOUS | 0 refills | Status: DC

## 2024-05-25 ENCOUNTER — Encounter: Admitting: Family Medicine

## 2024-05-30 ENCOUNTER — Other Ambulatory Visit: Payer: Self-pay | Admitting: Family Medicine

## 2024-06-07 ENCOUNTER — Other Ambulatory Visit: Payer: Self-pay | Admitting: Family Medicine

## 2024-06-13 MED ORDER — TIRZEPATIDE-WEIGHT MANAGEMENT 5 MG/0.5ML ~~LOC~~ SOLN: 5.0000 mg | SUBCUTANEOUS | 1 refills | Status: DC

## 2024-06-13 NOTE — Addendum Note (Signed)
 Addended by: JODIE GAMMONS on: 06/13/2024 09:35 AM   Modules accepted: Orders

## 2024-07-19 ENCOUNTER — Other Ambulatory Visit: Payer: Self-pay | Admitting: Family Medicine

## 2024-07-27 ENCOUNTER — Encounter: Payer: Self-pay | Admitting: Family Medicine

## 2024-07-27 ENCOUNTER — Ambulatory Visit: Admitting: Family Medicine

## 2024-07-27 VITALS — BP 95/68 | HR 74 | Temp 98.2°F | Ht 64.0 in | Wt 151.2 lb

## 2024-07-27 DIAGNOSIS — F339 Major depressive disorder, recurrent, unspecified: Secondary | ICD-10-CM | POA: Diagnosis not present

## 2024-07-27 DIAGNOSIS — Z23 Encounter for immunization: Secondary | ICD-10-CM | POA: Diagnosis not present

## 2024-07-27 DIAGNOSIS — E669 Obesity, unspecified: Secondary | ICD-10-CM | POA: Diagnosis not present

## 2024-07-27 MED ORDER — ZEPBOUND 5 MG/0.5ML ~~LOC~~ SOLN: 5.0000 mg | SUBCUTANEOUS | 5 refills | Status: AC

## 2024-07-27 NOTE — Progress Notes (Signed)
 Subjective  CC:  Chief Complaint  Patient presents with   Obesity    Pt has an appt next month for pap/mammo    HPI: Kelly Jensen is a 58 y.o. female who presents to the office today to address the problems listed above in the chief complaint. Discussed the use of AI scribe software for clinical note transcription with the patient, who gave verbal consent to proceed.  History of Present Illness Kelly Jensen is a 58 year old female who presents for follow-up regarding weight loss and mood medication management.  Follow up obesity: started zebpound about 8 weeks ago. Down 20 pounds (lost 8 pounds prior to starting by lowering lexapro dose from 20 to 10). She is currently at 150 pounds and aims to reach 130 pounds.  After increasing her medication dose from 2.5 mg to 5 mg, she experienced fatigue and feeling cold for about two to three weeks. She also reports occasional nausea and a general feeling of her stomach not being 'right' after taking her medication, although she does not feel sick. Despite these side effects, she maintains her nutrition by ensuring adequate protein intake, often through smoothies with protein powder, especially given her demanding job that requires her to be on her feet all day.  She has reduced her antidepressant dosage from 20 mg to 10 mg, which she believes contributed to an additional weight loss of 8 pounds before starting her current medication. Her mood is well controlled.   Eligible for prevnar 20   Assessment  1. Obesity (BMI 30-39.9)   2. Major depression, recurrent, chronic (HCC)   3. Need for pneumococcal 20-valent conjugate vaccination      Plan  Assessment and Plan Assessment & Plan Obesity Significant weight loss achieved with zepbound . Initial side effects improved. Back pain resolved, possibly due to weight loss or medication. Current weight is 150 pounds with a goal of 130 pounds. Discussed dosage adjustments for weight loss rate  and cost. - Continue medication at 5 mg. - Monitor weight loss rate; adjust dosage or frequency if necessary. - Encourage adequate nutrition and protein intake; consider multivitamin. - Reintroduce strength training. - Monitor for hair thinning; consider topical treatments if needed. - Send refill for Zepbound .  Depression Mood well-managed on reduced lexapro 10 mg dosage.  - Continue antidepressant at 10 mg.  General Health Maintenance Discussed Prevnar 20 vaccination for pneumonia prevention. Eligible at age 31. Provider shared personal experience of side effects but emphasized vaccination importance. - Administer Prevnar 20 vaccination.    Follow up: June 2026 for cpe Orders Placed This Encounter  Procedures   Pneumococcal conjugate vaccine 20-valent (Prevnar 20)   Meds ordered this encounter  Medications   tirzepatide  (ZEPBOUND ) 5 MG/0.5ML injection vial    Sig: Inject 5 mg into the skin once a week.    Dispense:  2 mL    Refill:  5    Please send refills for Zepbound  Vials if applicable.     I reviewed the patients updated PMH, FH, and SocHx.  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   escitalopram (LEXAPRO) 20 MG tablet Take 10 mg by mouth daily.   PRESCRIPTION MEDICATION Apply 1 application topically daily. Testosterone/progesterone  cream compounded at [facility] - prescribed by Dr Rosaline Luna    progesterone  (PROMETRIUM ) 200 MG capsule Take 1 capsule (200 mg total) by mouth at bedtime.   [DISCONTINUED] ZEPBOUND  5 MG/0.5ML injection vial INJECT 0.5 ML (5 MG) UNDER THE SKIN ONCE WEEKLY (0.5ML= 50 UNITS)   Allergies: Patient has no known allergies. Family History: Patient family history includes Chromosomal disorder in her daughter; Healthy in her brother, daughter, father, mother, sister, sister, sister, and son; Heart disease in her paternal grandfather. Social History:  Patient  reports that she has never smoked. She has never used smokeless tobacco. She reports current alcohol use of about 2.0 standard drinks of alcohol per week. She reports that she does not use drugs.  Review of Systems: Constitutional: Negative for fever malaise or anorexia Cardiovascular: negative for chest pain Respiratory: negative for SOB or persistent cough Gastrointestinal: negative for abdominal pain  Objective  Vitals: BP 95/68   Pulse 74   Temp 98.2 F (36.8 C)   Ht 5' 4 (1.626 m)   Wt 151 lb 3.2 oz (68.6 kg)   SpO2 97%   BMI 25.95 kg/m  General: no acute distress , A&Ox3  Commons side effects, risks, benefits, and alternatives for medications and treatment plan prescribed today were discussed, and the patient expressed understanding of the given instructions. Patient is instructed to call or message via MyChart if he/she has any questions or concerns regarding our treatment plan. No barriers to understanding were identified. We discussed Red Flag symptoms and signs in detail. Patient expressed understanding regarding what to do in case of urgent or emergency type symptoms.  Medication list was reconciled, printed and provided to the patient in AVS. Patient instructions and summary information was reviewed with the patient as documented in the AVS. This note was prepared with assistance of Dragon voice recognition software. Occasional wrong-word or sound-a-like substitutions may have occurred due  to the inherent limitations of voice recognition software

## 2024-07-27 NOTE — Patient Instructions (Signed)
 Please return in June 2026 for your annual complete physical; please come fasting.   If you have any questions or concerns, please don't hesitate to send me a message via MyChart or call the office at [phone]. Thank you for visiting with us  today! It's our pleasure caring for you.

## 2024-08-28 DIAGNOSIS — Z124 Encounter for screening for malignant neoplasm of cervix: Secondary | ICD-10-CM | POA: Diagnosis not present

## 2024-08-28 DIAGNOSIS — Z1231 Encounter for screening mammogram for malignant neoplasm of breast: Secondary | ICD-10-CM | POA: Diagnosis not present

## 2024-08-28 DIAGNOSIS — Z01419 Encounter for gynecological examination (general) (routine) without abnormal findings: Secondary | ICD-10-CM | POA: Diagnosis not present

## 2024-10-23 ENCOUNTER — Other Ambulatory Visit: Payer: Self-pay | Admitting: General Practice

## 2024-10-23 DIAGNOSIS — M26631 Articular disc disorder of right temporomandibular joint: Secondary | ICD-10-CM

## 2024-10-24 ENCOUNTER — Other Ambulatory Visit: Payer: Self-pay | Admitting: General Practice

## 2024-10-24 DIAGNOSIS — M26632 Articular disc disorder of left temporomandibular joint: Secondary | ICD-10-CM

## 2024-10-31 ENCOUNTER — Encounter: Payer: Self-pay | Admitting: General Practice

## 2024-11-05 ENCOUNTER — Inpatient Hospital Stay: Admission: RE | Admit: 2024-11-05

## 2024-11-05 ENCOUNTER — Ambulatory Visit
Admission: RE | Admit: 2024-11-05 | Discharge: 2024-11-05 | Disposition: A | Source: Ambulatory Visit | Attending: General Practice | Admitting: General Practice

## 2024-11-05 DIAGNOSIS — R6884 Jaw pain: Secondary | ICD-10-CM | POA: Diagnosis not present

## 2024-11-05 DIAGNOSIS — M26632 Articular disc disorder of left temporomandibular joint: Secondary | ICD-10-CM
